# Patient Record
Sex: Male | Born: 1940
Health system: Southern US, Community
[De-identification: ages and names within clinical notes are randomized; demographics above are authoritative.]

## PROBLEM LIST (undated history)

## (undated) DIAGNOSIS — H18519 Endothelial corneal dystrophy, unspecified eye: Secondary | ICD-10-CM

## (undated) DIAGNOSIS — E785 Hyperlipidemia, unspecified: Secondary | ICD-10-CM

## (undated) DIAGNOSIS — H1851 Endothelial corneal dystrophy: Secondary | ICD-10-CM

## (undated) DIAGNOSIS — I10 Essential (primary) hypertension: Secondary | ICD-10-CM

## (undated) DIAGNOSIS — D649 Anemia, unspecified: Secondary | ICD-10-CM

## (undated) DIAGNOSIS — Z8546 Personal history of malignant neoplasm of prostate: Secondary | ICD-10-CM

## (undated) HISTORY — DX: Endothelial corneal dystrophy: H18.51

## (undated) HISTORY — DX: Hyperlipidemia, unspecified: E78.5

## (undated) HISTORY — DX: Anemia, unspecified: D64.9

## (undated) HISTORY — DX: Personal history of malignant neoplasm of prostate: Z85.46

## (undated) HISTORY — DX: Endothelial corneal dystrophy, unspecified eye: H18.519

## (undated) HISTORY — PX: EYE SURGERY: SHX253

## (undated) HISTORY — DX: Essential (primary) hypertension: I10

---

## 1996-05-06 HISTORY — PX: PROSTATE SURGERY: SHX751

## 2001-05-06 HISTORY — PX: OTHER SURGICAL HISTORY: SHX169

## 2003-04-21 ENCOUNTER — Encounter: Payer: Self-pay | Admitting: Internal Medicine

## 2003-05-30 ENCOUNTER — Encounter: Admission: RE | Admit: 2003-05-30 | Discharge: 2003-05-30 | Payer: Self-pay | Admitting: Internal Medicine

## 2003-07-28 ENCOUNTER — Encounter: Admission: RE | Admit: 2003-07-28 | Discharge: 2003-07-28 | Payer: Self-pay | Admitting: Internal Medicine

## 2004-06-11 ENCOUNTER — Ambulatory Visit: Payer: Self-pay | Admitting: Internal Medicine

## 2004-06-14 ENCOUNTER — Encounter: Admission: RE | Admit: 2004-06-14 | Discharge: 2004-06-14 | Payer: Self-pay | Admitting: Internal Medicine

## 2004-06-21 ENCOUNTER — Ambulatory Visit: Payer: Self-pay | Admitting: Gastroenterology

## 2004-07-03 ENCOUNTER — Ambulatory Visit: Payer: Self-pay | Admitting: Internal Medicine

## 2004-09-10 ENCOUNTER — Ambulatory Visit: Payer: Self-pay | Admitting: Internal Medicine

## 2004-09-24 ENCOUNTER — Ambulatory Visit: Payer: Self-pay | Admitting: Internal Medicine

## 2004-10-23 ENCOUNTER — Ambulatory Visit: Payer: Self-pay | Admitting: Internal Medicine

## 2005-03-04 ENCOUNTER — Ambulatory Visit: Payer: Self-pay | Admitting: Internal Medicine

## 2005-07-17 ENCOUNTER — Ambulatory Visit: Payer: Self-pay | Admitting: Family Medicine

## 2005-08-14 ENCOUNTER — Ambulatory Visit: Payer: Self-pay | Admitting: Internal Medicine

## 2005-08-23 ENCOUNTER — Ambulatory Visit: Payer: Self-pay | Admitting: Internal Medicine

## 2006-01-08 ENCOUNTER — Ambulatory Visit: Payer: Self-pay | Admitting: Internal Medicine

## 2006-03-19 ENCOUNTER — Ambulatory Visit: Payer: Self-pay | Admitting: Internal Medicine

## 2006-05-05 ENCOUNTER — Ambulatory Visit: Payer: Self-pay | Admitting: Family Medicine

## 2006-05-19 ENCOUNTER — Ambulatory Visit: Payer: Self-pay | Admitting: Internal Medicine

## 2006-05-21 ENCOUNTER — Encounter: Admission: RE | Admit: 2006-05-21 | Discharge: 2006-05-21 | Payer: Self-pay | Admitting: Internal Medicine

## 2006-06-12 ENCOUNTER — Ambulatory Visit: Payer: Self-pay | Admitting: Infectious Diseases

## 2006-06-24 DIAGNOSIS — I10 Essential (primary) hypertension: Secondary | ICD-10-CM | POA: Insufficient documentation

## 2006-06-24 DIAGNOSIS — Z8669 Personal history of other diseases of the nervous system and sense organs: Secondary | ICD-10-CM | POA: Insufficient documentation

## 2006-06-24 DIAGNOSIS — B957 Other staphylococcus as the cause of diseases classified elsewhere: Secondary | ICD-10-CM | POA: Insufficient documentation

## 2006-06-24 DIAGNOSIS — Z8614 Personal history of Methicillin resistant Staphylococcus aureus infection: Secondary | ICD-10-CM | POA: Insufficient documentation

## 2006-06-24 DIAGNOSIS — Z8546 Personal history of malignant neoplasm of prostate: Secondary | ICD-10-CM | POA: Insufficient documentation

## 2006-06-24 DIAGNOSIS — D179 Benign lipomatous neoplasm, unspecified: Secondary | ICD-10-CM | POA: Insufficient documentation

## 2006-06-24 DIAGNOSIS — E785 Hyperlipidemia, unspecified: Secondary | ICD-10-CM | POA: Insufficient documentation

## 2006-09-10 ENCOUNTER — Ambulatory Visit: Payer: Self-pay | Admitting: Infectious Diseases

## 2006-11-06 ENCOUNTER — Ambulatory Visit: Payer: Self-pay | Admitting: Internal Medicine

## 2006-12-29 ENCOUNTER — Telehealth (INDEPENDENT_AMBULATORY_CARE_PROVIDER_SITE_OTHER): Payer: Self-pay | Admitting: *Deleted

## 2006-12-30 ENCOUNTER — Ambulatory Visit: Payer: Self-pay | Admitting: Internal Medicine

## 2007-01-05 LAB — CONVERTED CEMR LAB
ALT: 21 units/L (ref 0–53)
AST: 21 units/L (ref 0–37)
Cholesterol: 132 mg/dL (ref 0–200)
HDL: 46.2 mg/dL (ref >39.0)
LDL Cholesterol: 76 mg/dL (ref 0–99)
Total CHOL/HDL Ratio: 2.9
Triglycerides: 48 mg/dL (ref 0–149)
VLDL: 10 mg/dL (ref 0–40)

## 2007-01-06 ENCOUNTER — Ambulatory Visit: Payer: Self-pay | Admitting: Internal Medicine

## 2007-01-06 ENCOUNTER — Encounter (INDEPENDENT_AMBULATORY_CARE_PROVIDER_SITE_OTHER): Payer: Self-pay | Admitting: *Deleted

## 2007-01-06 LAB — CONVERTED CEMR LAB
Cholesterol, target level: 200 mg/dL
HDL goal, serum: 40 mg/dL
LDL Goal: 130 mg/dL

## 2007-04-09 ENCOUNTER — Telehealth (INDEPENDENT_AMBULATORY_CARE_PROVIDER_SITE_OTHER): Payer: Self-pay | Admitting: *Deleted

## 2007-04-28 ENCOUNTER — Encounter (INDEPENDENT_AMBULATORY_CARE_PROVIDER_SITE_OTHER): Payer: Self-pay | Admitting: *Deleted

## 2007-05-07 HISTORY — PX: COLONOSCOPY: SHX174

## 2007-09-04 ENCOUNTER — Ambulatory Visit: Payer: Self-pay | Admitting: Gastroenterology

## 2007-09-18 ENCOUNTER — Ambulatory Visit: Payer: Self-pay | Admitting: Gastroenterology

## 2007-09-21 ENCOUNTER — Ambulatory Visit (HOSPITAL_COMMUNITY): Admission: RE | Admit: 2007-09-21 | Discharge: 2007-09-21 | Payer: Self-pay | Admitting: Gastroenterology

## 2007-09-23 ENCOUNTER — Ambulatory Visit (HOSPITAL_COMMUNITY): Admission: RE | Admit: 2007-09-23 | Discharge: 2007-09-23 | Payer: Self-pay | Admitting: Gastroenterology

## 2008-02-10 ENCOUNTER — Ambulatory Visit: Payer: Self-pay | Admitting: Internal Medicine

## 2008-03-21 ENCOUNTER — Telehealth (INDEPENDENT_AMBULATORY_CARE_PROVIDER_SITE_OTHER): Payer: Self-pay | Admitting: *Deleted

## 2008-03-25 ENCOUNTER — Ambulatory Visit: Payer: Self-pay | Admitting: Internal Medicine

## 2008-04-04 ENCOUNTER — Telehealth (INDEPENDENT_AMBULATORY_CARE_PROVIDER_SITE_OTHER): Payer: Self-pay | Admitting: *Deleted

## 2008-04-04 LAB — CONVERTED CEMR LAB
ALT: 24 units/L (ref 0–53)
AST: 29 units/L (ref 0–37)
Albumin: 3.5 g/dL (ref 3.5–5.2)
Alkaline Phosphatase: 42 units/L (ref 39–117)
BUN: 18 mg/dL (ref 6–23)
Basophils Absolute: 0 10*3/uL (ref 0.0–0.1)
Basophils Relative: 0.3 % (ref 0.0–3.0)
Bilirubin, Direct: 0.1 mg/dL (ref 0.0–0.3)
CO2: 27 meq/L (ref 19–32)
Calcium: 9 mg/dL (ref 8.4–10.5)
Chloride: 107 meq/L (ref 96–112)
Cholesterol: 142 mg/dL (ref 0–200)
Creatinine, Ser: 1.2 mg/dL (ref 0.4–1.5)
Eosinophils Absolute: 0.2 10*3/uL (ref 0.0–0.7)
Eosinophils Relative: 4.6 % (ref 0.0–5.0)
GFR calc Af Amer: 78 mL/min
GFR calc non Af Amer: 64 mL/min
Glucose, Bld: 85 mg/dL (ref 70–99)
HCT: 36.8 % — ABNORMAL LOW (ref 39.0–52.0)
HDL: 53.8 mg/dL (ref >39.0)
Hemoglobin: 12.4 g/dL — ABNORMAL LOW (ref 13.0–17.0)
LDL Cholesterol: 81 mg/dL (ref 0–99)
Lymphocytes Relative: 38.9 % (ref 12.0–46.0)
MCHC: 33.8 g/dL (ref 30.0–36.0)
MCV: 84.8 fL (ref 78.0–100.0)
Monocytes Absolute: 0.5 10*3/uL (ref 0.1–1.0)
Monocytes Relative: 10.4 % (ref 3.0–12.0)
Neutro Abs: 2 10*3/uL (ref 1.4–7.7)
Neutrophils Relative %: 45.8 % (ref 43.0–77.0)
PSA: 0.01 ng/mL — ABNORMAL LOW (ref 0.10–4.00)
Platelets: 187 10*3/uL (ref 150–400)
Potassium: 4 meq/L (ref 3.5–5.1)
RBC: 4.34 M/uL (ref 4.22–5.81)
RDW: 14.9 % — ABNORMAL HIGH (ref 11.5–14.6)
Sodium: 139 meq/L (ref 135–145)
Total Bilirubin: 1 mg/dL (ref 0.3–1.2)
Total CHOL/HDL Ratio: 2.6
Total Protein: 6.7 g/dL (ref 6.0–8.3)
Triglycerides: 36 mg/dL (ref 0–149)
VLDL: 7 mg/dL (ref 0–40)
WBC: 4.5 10*3/uL (ref 4.5–10.5)

## 2008-04-05 ENCOUNTER — Encounter (INDEPENDENT_AMBULATORY_CARE_PROVIDER_SITE_OTHER): Payer: Self-pay | Admitting: *Deleted

## 2008-04-05 ENCOUNTER — Ambulatory Visit: Payer: Self-pay | Admitting: Internal Medicine

## 2008-04-08 ENCOUNTER — Ambulatory Visit: Payer: Self-pay | Admitting: Internal Medicine

## 2008-04-08 DIAGNOSIS — D649 Anemia, unspecified: Secondary | ICD-10-CM | POA: Insufficient documentation

## 2008-04-13 ENCOUNTER — Encounter (INDEPENDENT_AMBULATORY_CARE_PROVIDER_SITE_OTHER): Payer: Self-pay | Admitting: *Deleted

## 2008-04-13 ENCOUNTER — Ambulatory Visit: Payer: Self-pay | Admitting: Internal Medicine

## 2008-04-13 LAB — CONVERTED CEMR LAB
OCCULT 1: NEGATIVE
OCCULT 2: NEGATIVE
OCCULT 3: NEGATIVE

## 2008-08-18 ENCOUNTER — Ambulatory Visit: Payer: Self-pay | Admitting: Internal Medicine

## 2008-08-28 LAB — CONVERTED CEMR LAB
Basophils Absolute: 0 10*3/uL (ref 0.0–0.1)
Basophils Relative: 0.2 % (ref 0.0–3.0)
Cholesterol: 183 mg/dL (ref 0–200)
Eosinophils Absolute: 0.2 10*3/uL (ref 0.0–0.7)
Eosinophils Relative: 4.8 % (ref 0.0–5.0)
HCT: 39.2 % (ref 39.0–52.0)
HDL: 49.6 mg/dL (ref >39.00)
Hemoglobin: 13 g/dL (ref 13.0–17.0)
LDL Cholesterol: 125 mg/dL — ABNORMAL HIGH (ref 0–99)
Lymphocytes Relative: 37.2 % (ref 12.0–46.0)
Lymphs Abs: 1.7 10*3/uL (ref 0.7–4.0)
MCHC: 33.1 g/dL (ref 30.0–36.0)
MCV: 85 fL (ref 78.0–100.0)
Monocytes Absolute: 0.5 10*3/uL (ref 0.1–1.0)
Monocytes Relative: 9.7 % (ref 3.0–12.0)
Neutro Abs: 2.3 10*3/uL (ref 1.4–7.7)
Neutrophils Relative %: 48.1 % (ref 43.0–77.0)
Platelets: 211 10*3/uL (ref 150.0–400.0)
RBC: 4.61 M/uL (ref 4.22–5.81)
RDW: 16.7 % — ABNORMAL HIGH (ref 11.5–14.6)
Total CHOL/HDL Ratio: 4
Triglycerides: 42 mg/dL (ref 0.0–149.0)
VLDL: 8.4 mg/dL (ref 0.0–40.0)
WBC: 4.7 10*3/uL (ref 4.5–10.5)

## 2008-08-29 ENCOUNTER — Encounter (INDEPENDENT_AMBULATORY_CARE_PROVIDER_SITE_OTHER): Payer: Self-pay | Admitting: *Deleted

## 2008-09-02 ENCOUNTER — Ambulatory Visit: Payer: Self-pay | Admitting: Internal Medicine

## 2008-09-02 LAB — CONVERTED CEMR LAB
Cholesterol, target level: 200 mg/dL
HDL goal, serum: 40 mg/dL
LDL Goal: 160 mg/dL

## 2008-10-10 ENCOUNTER — Ambulatory Visit: Payer: Self-pay | Admitting: Infectious Diseases

## 2008-10-10 ENCOUNTER — Encounter (INDEPENDENT_AMBULATORY_CARE_PROVIDER_SITE_OTHER): Payer: Self-pay | Admitting: Family Medicine

## 2009-02-07 ENCOUNTER — Ambulatory Visit: Payer: Self-pay | Admitting: Internal Medicine

## 2009-06-14 ENCOUNTER — Telehealth (INDEPENDENT_AMBULATORY_CARE_PROVIDER_SITE_OTHER): Payer: Self-pay | Admitting: *Deleted

## 2009-08-16 IMAGING — CR DG ABDOMEN 1V
1 series · 1 of 1 positions shown · non-contrast
Comparison: CT dated 06/14/2004.

CLINICAL DATA: Incomplete screening colonoscopy.  Previous
hyperplastic colon polyps.

ABDOMEN - 1 VIEW

[view not recorded]
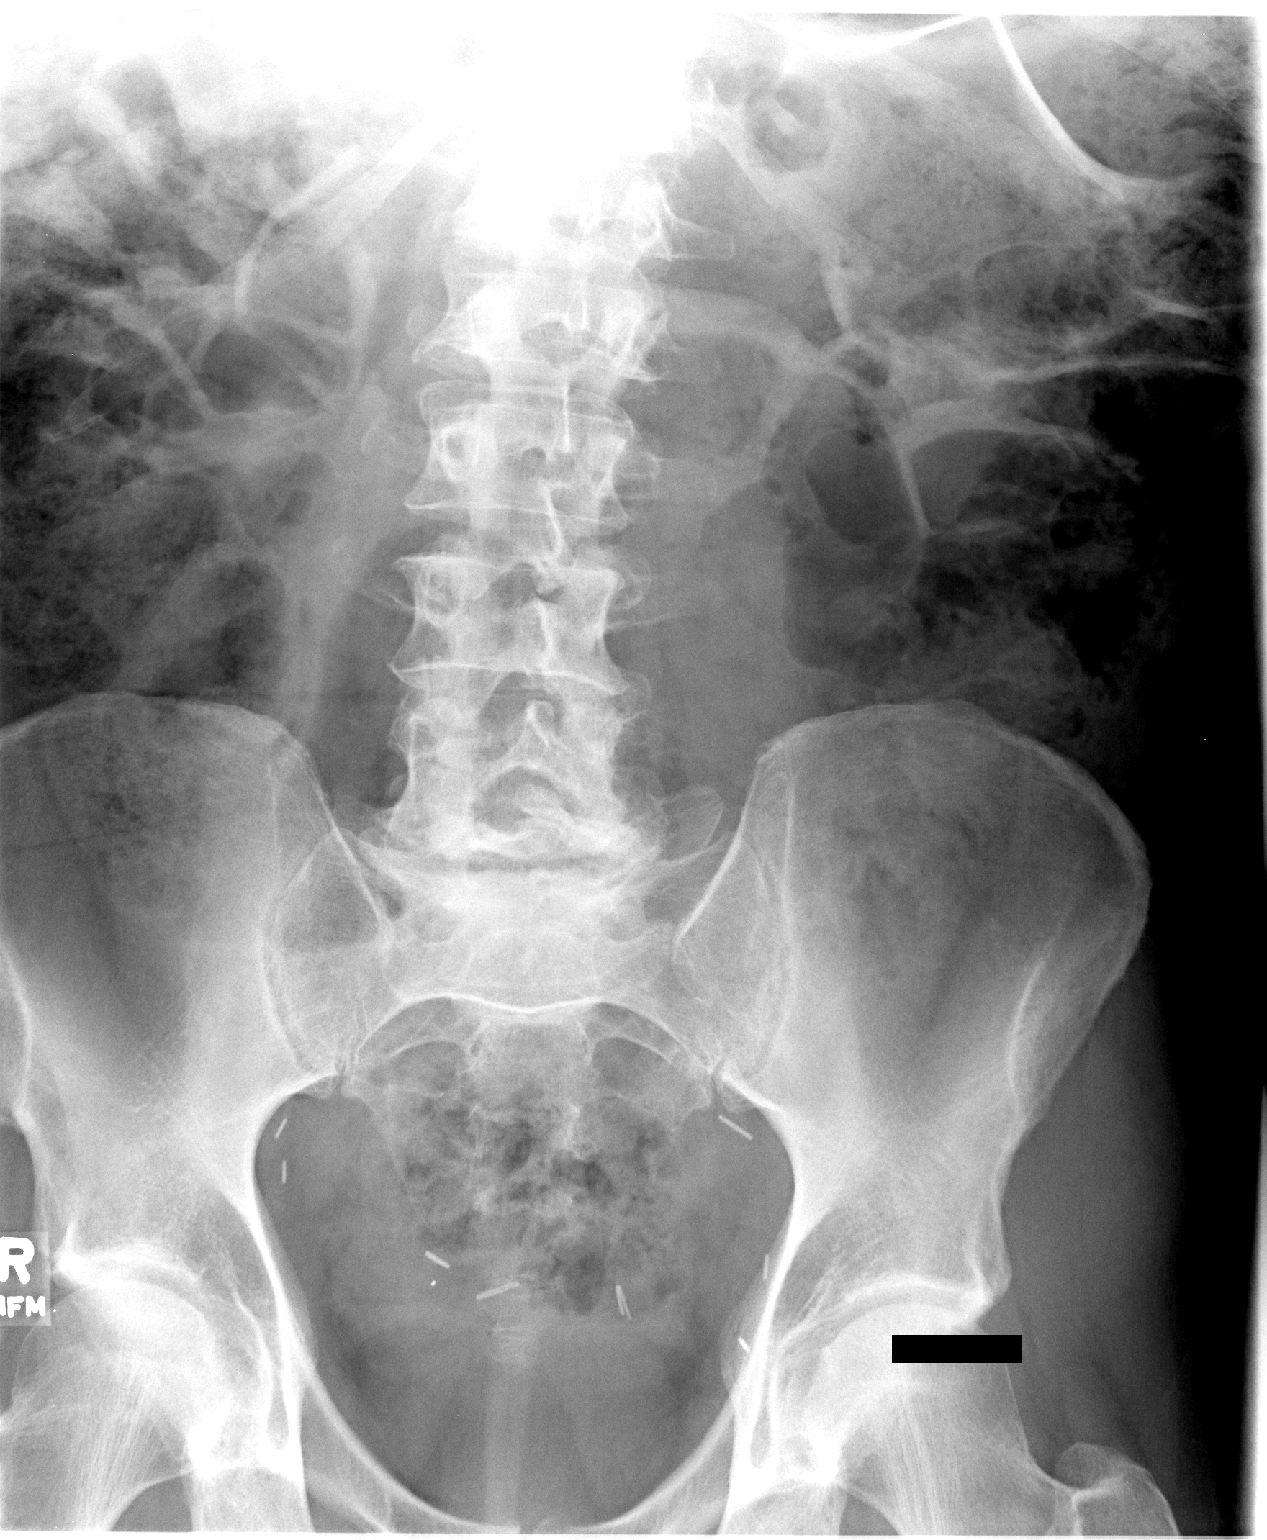

[1 of 1 positions shown; findings below may reference images not displayed]

FINDINGS: A supine radiograph of the abdomen was obtained as a
scout image for a scheduled barium enema.  This demonstrates a
normal bowel gas pattern with a significant amount of stool in the
cecum and right colon as well as in the distal transverse colon and
splenic flexure.  Bilateral pelvic surgical clips are noted.  Also
demonstrated are mild lumbar spine degenerative changes and
scoliosis.
IMPRESSION: Significant amount of retained stool in the colon.  This precludes
a detailed barium enema at this time.  This is being rescheduled
following colon cleansing.

## 2009-08-18 IMAGING — RF DG BE W/ AIR HIGH DENSITY
13 of 24 series · 13 of 24 positions shown · IV contrast (agent unspecified)
Comparison: CT abdomen pelvis 06/14/2004

CLINICAL DATA: Patient with history of prior colonic polyps
removed.  The patient had recent colonoscopy.  Due to the elongate
tortuous colon, the cecum through the hepatic flexure were not well
visualized.

AIR-CONTRAST BARIUM ENEMA
TECHNIQUE: After obtaining a scout radiograph air-contrast barium
enema was performed under fluoroscopy using high-density barium.
Contrast: :Polibar contrast

[Series 1: run · 1 of 1 slices shown (1 of 10)]
[im 1/1]
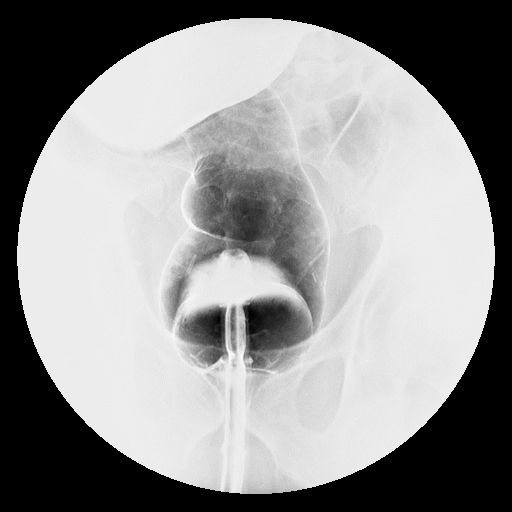

[Series 3: run · 1 of 2 slices shown (2 of 10)]
[im 1/2]
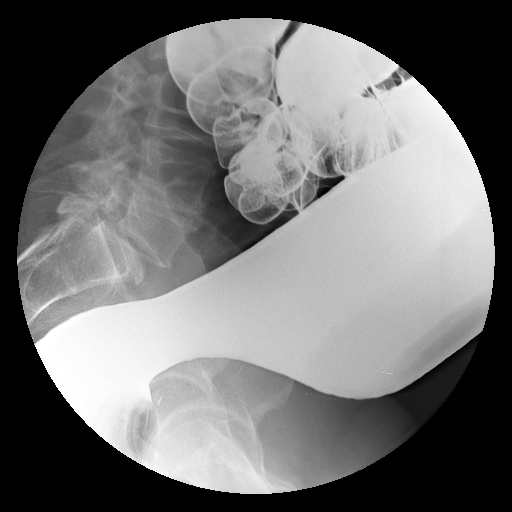

[Series 5: run · 1 of 1 slices shown (3 of 10)]
[im 1/1]
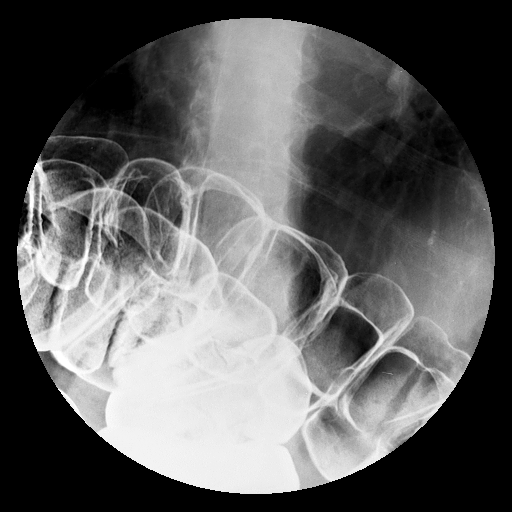

[Series 7: run · 1 of 1 slices shown (4 of 10)]
[im 1/1]
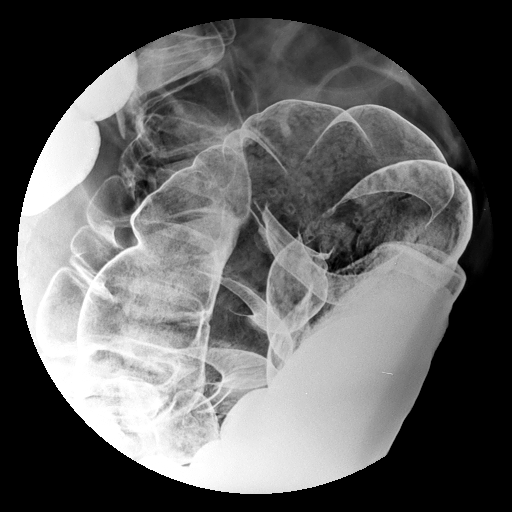

[Series 9: run · 1 of 1 slices shown (5 of 10)]
[im 1/1]
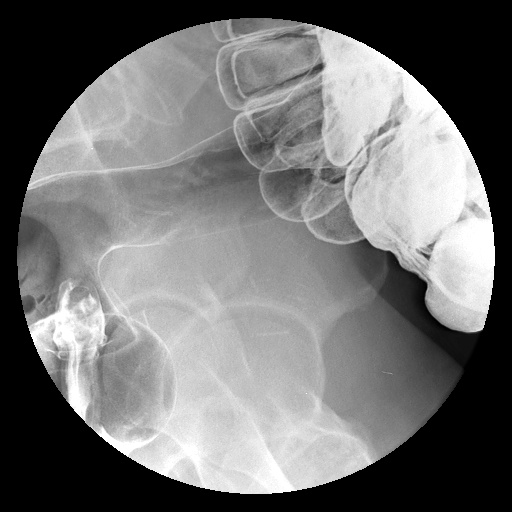

[Series 11: run · 1 of 1 slices shown (6 of 10)]
[im 1/1]
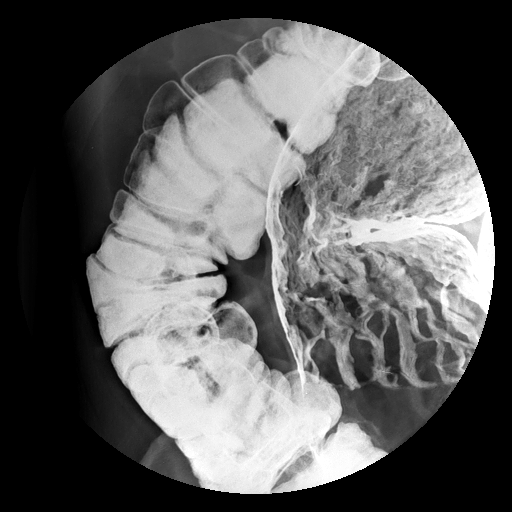

[Series 13: run · 1 of 1 slices shown (7 of 10)]
[im 1/1]
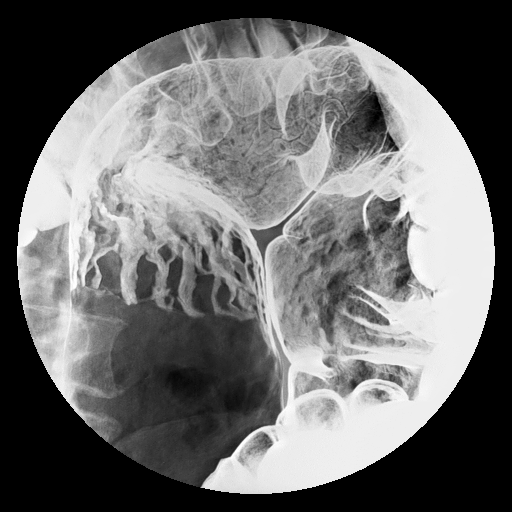

[Series 14: run · 1 of 2 slices shown (8 of 10)]
[im 1/2]
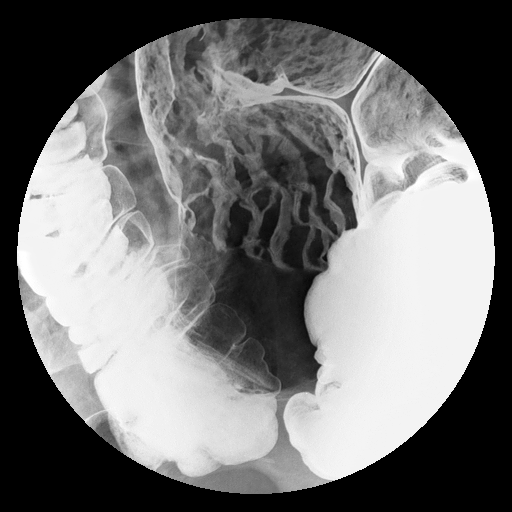

[Series 16: run · 1 of 2 slices shown (9 of 10)]
[im 1/2]
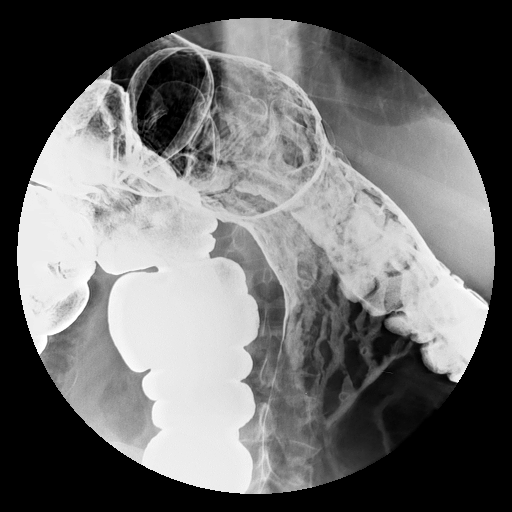

[Series 18: run · 1 of 1 slices shown (10 of 10)]
[im 1/1]
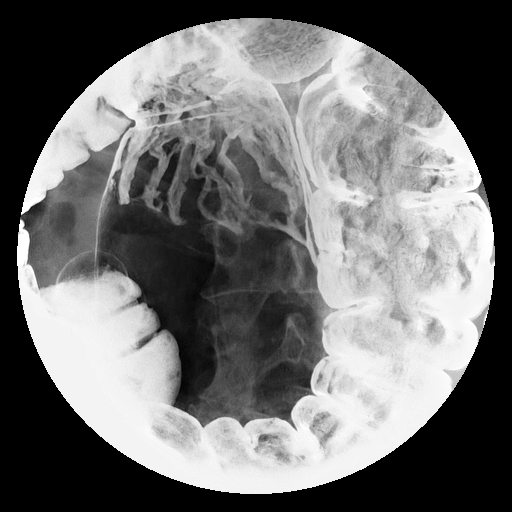

[Series 1002: view not recorded · 0.20mm/px · 1 of 1 slices shown (1 of 3)]
[im 1/1]
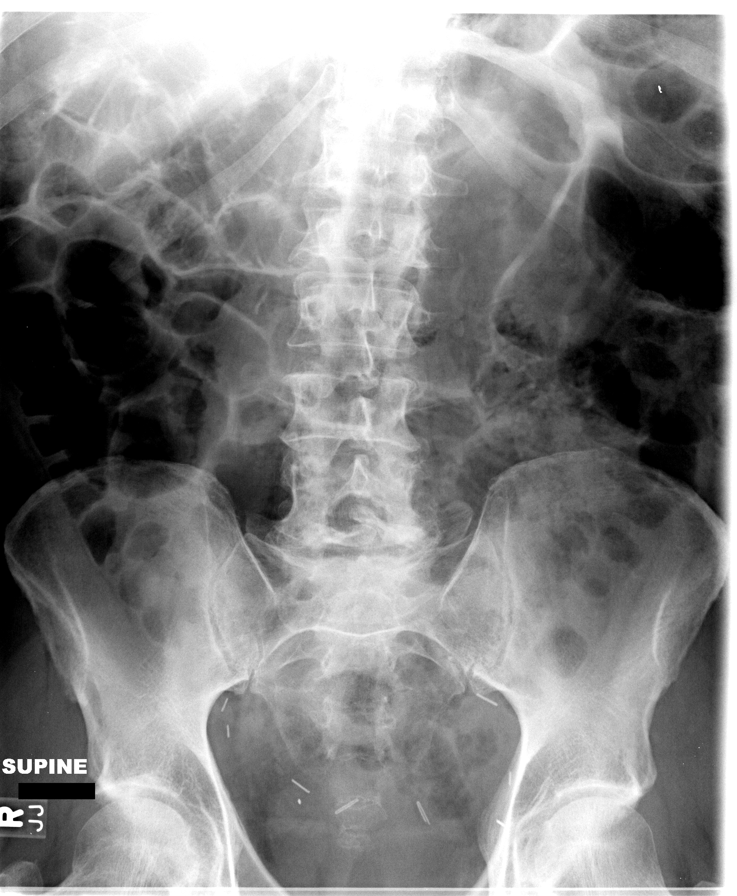

[Series 1004: view not recorded · 0.20mm/px · 1 of 1 slices shown (2 of 3)]
[im 1/1]
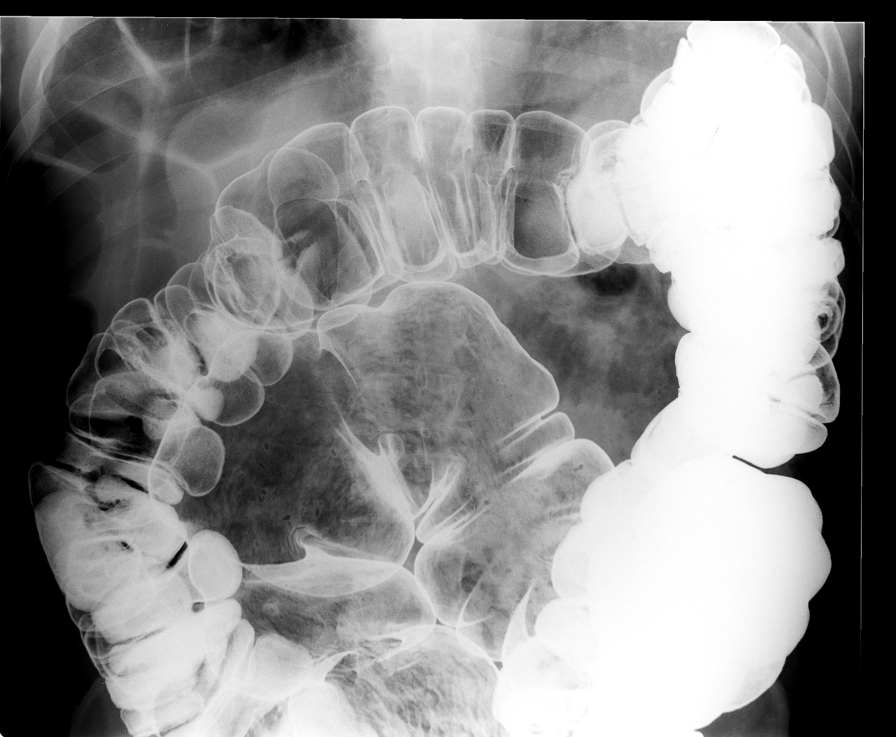

[Series 1006: view not recorded · 0.20mm/px · 1 of 1 slices shown (3 of 3)]
[im 1/1]
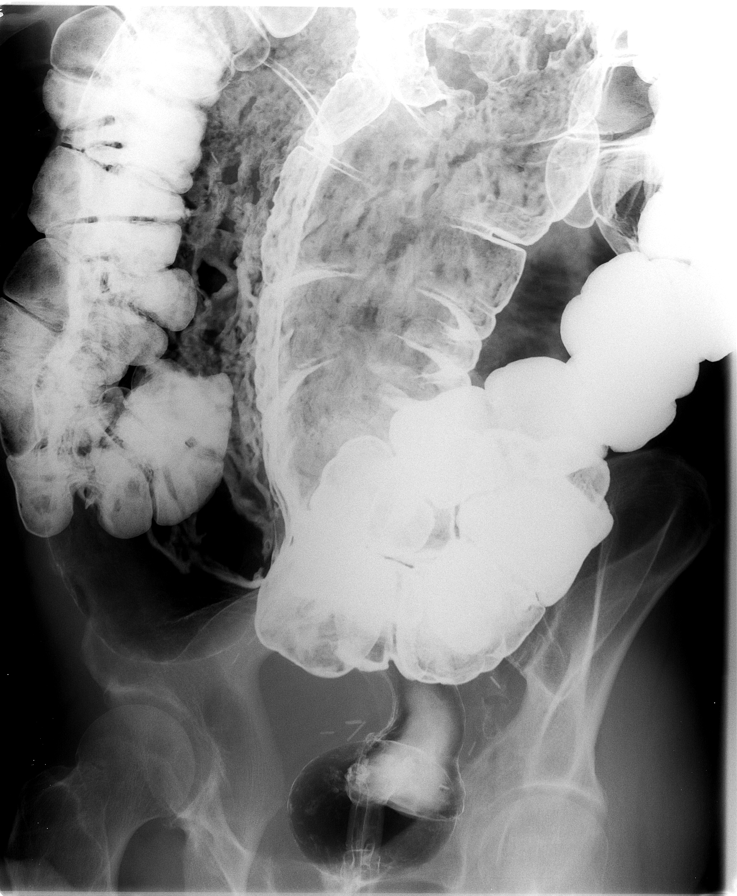

[13 of 24 positions shown; findings below may reference images not displayed]

FINDINGS: A scout view of the abdomen and pelvis shows no evidence
of retained stool.  Gas is seen throughout the entire colon and
rectum, without evidence of obstruction.  Surgical clips are
identified in the pelvis.

The sigmoid colon is redundant and very capacious.  The sigmoid
colon collects contrast, requiring  many maneuvers of the patient
to have contrast egress from the sigmoid colon.  Is also noted that
the distal sigmoid is very smooth, with a loss of haustral markings
in its inferior aspect. After filling of the colon with contrast
and air, multiple attempts were made to drain the barium for an
adequate double contrast study.  However due to the the patient's
capacious sigmoid colon it was difficult to well to completely
drain the barium.  No obstructing mass or stricture is seen in the
cecum or ascending colon.  Small polyps cannot be excluded from the
right side of the colon.  The flexures, transverse colon,
descending colon are visualized with better air contrast technique
and show no evidence of polyp, mass, or stricture.
IMPRESSION: 1.  Limited double contrast enema due to a redundant colon, and a
large, capacious and atonic sigmoid colon.  The sigmoid colon has a
a very smooth segment with loss of haustral markings. Question if
there is a history of chronic constipation.
2.  It is difficult to exclude polyps in the region of the cecum or
ascending colon.
3.  No large mass, stricture or obstruction is identified

## 2009-10-19 ENCOUNTER — Ambulatory Visit: Payer: Self-pay | Admitting: Internal Medicine

## 2009-10-20 ENCOUNTER — Ambulatory Visit: Payer: Self-pay | Admitting: Internal Medicine

## 2009-10-26 ENCOUNTER — Ambulatory Visit: Payer: Self-pay | Admitting: Internal Medicine

## 2009-10-27 LAB — CONVERTED CEMR LAB
ALT: 23 units/L (ref 0–53)
AST: 22 units/L (ref 0–37)
Albumin: 3.8 g/dL (ref 3.5–5.2)
Alkaline Phosphatase: 51 units/L (ref 39–117)
BUN: 18 mg/dL (ref 6–23)
Basophils Absolute: 0 10*3/uL (ref 0.0–0.1)
Basophils Relative: 0.6 % (ref 0.0–3.0)
Bilirubin, Direct: 0.1 mg/dL (ref 0.0–0.3)
CO2: 29 meq/L (ref 19–32)
Calcium: 9.3 mg/dL (ref 8.4–10.5)
Chloride: 107 meq/L (ref 96–112)
Cholesterol: 171 mg/dL (ref 0–200)
Creatinine, Ser: 1.1 mg/dL (ref 0.4–1.5)
Eosinophils Absolute: 0.2 10*3/uL (ref 0.0–0.7)
Eosinophils Relative: 4 % (ref 0.0–5.0)
GFR calc non Af Amer: 67.66 mL/min (ref >60)
Glucose, Bld: 94 mg/dL (ref 70–99)
HCT: 45 % (ref 39.0–52.0)
HDL: 41.9 mg/dL (ref >39.00)
Hemoglobin: 15.4 g/dL (ref 13.0–17.0)
LDL Cholesterol: 113 mg/dL — ABNORMAL HIGH (ref 0–99)
Lymphocytes Relative: 33.2 % (ref 12.0–46.0)
Lymphs Abs: 1.8 10*3/uL (ref 0.7–4.0)
MCHC: 34.2 g/dL (ref 30.0–36.0)
MCV: 93.5 fL (ref 78.0–100.0)
Monocytes Absolute: 0.4 10*3/uL (ref 0.1–1.0)
Monocytes Relative: 6.7 % (ref 3.0–12.0)
Neutro Abs: 3 10*3/uL (ref 1.4–7.7)
Neutrophils Relative %: 55.5 % (ref 43.0–77.0)
PSA: 0 ng/mL — ABNORMAL LOW (ref 0.10–4.00)
Platelets: 218 10*3/uL (ref 150.0–400.0)
Potassium: 5.3 meq/L — ABNORMAL HIGH (ref 3.5–5.1)
RBC: 4.81 M/uL (ref 4.22–5.81)
RDW: 13.1 % (ref 11.5–14.6)
Sodium: 141 meq/L (ref 135–145)
TSH: 1.95 microintl units/mL (ref 0.35–5.50)
Total Bilirubin: 0.8 mg/dL (ref 0.3–1.2)
Total CHOL/HDL Ratio: 4
Total Protein: 6.2 g/dL (ref 6.0–8.3)
Triglycerides: 79 mg/dL (ref 0.0–149.0)
VLDL: 15.8 mg/dL (ref 0.0–40.0)
WBC: 5.4 10*3/uL (ref 4.5–10.5)

## 2010-02-22 ENCOUNTER — Ambulatory Visit: Payer: Self-pay | Admitting: Internal Medicine

## 2010-06-03 LAB — CONVERTED CEMR LAB
Cholesterol, target level: 200 mg/dL
HDL goal, serum: 40 mg/dL
LDL Goal: 160 mg/dL

## 2010-06-05 NOTE — Assessment & Plan Note (Signed)
Summary: med refills//lch   Vital Signs:  Patient profile:   70 year old male Height:      71.5 inches Weight:      178.8 pounds BMI:     24.68 Temp:     99.1 degrees F oral Pulse rate:   72 / minute Resp:     14 per minute BP sitting:   128 / 80  (left arm) Cuff size:   large  Vitals Entered By: Shonna Chock (October 19, 2009 3:35 PM) CC: CPX and refill Diovan & Crestor Comments REVIEWED MED LIST, PATIENT AGREED DOSE AND INSTRUCTION CORRECT    CC:  CPX and refill Diovan & Crestor.  History of Present Illness: Here for Medicare AWV:  1.   Risk factors based on Past M, S, F history:PMH of MRSA, dyslipidemia,HTN 2.   Physical Activitieswalking once daily < 30 min 3.   Depression/mood: denied 4.   Hearing: whisper heard @ 6 ft 5.   ADL's: no limitations 6.   Fall Risk: minimal balance issues ("aging"), no falls to date(see HTN review below). Gait normal  to observation 7.   Home Safety: no risks identified 8.   Height, weight, &visual acuity:see VS . Temp minimally elevated w/o symptoms/signs of infection.Followed @ least annually by Dr Noel Gerold for Fuchs Dystrophy. Vision grossly normal with lenses  reading wall charts  9.   Counseling: no need identified 10.   Labs ordered based on risk factors: see Orders 11.           Referral Coordination: none  12.           Care Plan: See Instructions; Immunizations updated 13.            Cognitive Assessment:OX3. He read & wrote answers to these queries. Memory & recall normal.    Hyperlipidemia Follow-Up      This is a 70 year old man who presents for Hyperlipidemia follow-up.  The patient denies muscle aches, GI upset, flushing, itching, constipation, diarrhea, and fatigue.  The patient denies the following symptoms: chest pain/pressure, exercise intolerance, dypsnea, and palpitations.  Compliance with medications (by patient report) has been near 100%.  Dietary compliance has been good.  The patient reports exercising daily.  Adjunctive  measures currently used by the patient include ASA, folic acid, Niacin  and fish oil supplements.  On Crestor 20 mg 1/2 pill weekly  He is due for Urologic F/U with Dr Aldean Ast.Last appt was ? 2009  Hypertension Follow-Up      The patient also presents for Hypertension follow-up.  The patient denies lightheadedness, urinary frequency, headaches, rash, and fatigue.  Compliance with medications (by patient report) has been near 100%.  Adjunctive measures currently used by the patient include salt restriction.    Preventive Screening-Counseling & Management  Alcohol-Tobacco     Alcohol drinks/day: <1     Smoking Status: never  Caffeine-Diet-Exercise     Caffeine use/day: 2 cups / day     Diet Comments: none     Does Patient Exercise: yes     Type of exercise: walking     Exercise (avg: min/session): <30     Times/week: 7  Hep-HIV-STD-Contraception     Dental Visit-last 6 months yes  Safety-Violence-Falls     Seat Belt Use: yes     Smoke Detectors: yes     Violence in the Home: no risk noted     Fall Risk: none  Allergies: 1)  ! Accupril  Past History:  Past Medical History: MRSA skin infections Hyperlipidemia Hypertension Prostate cancer, hx of, Dr Leonarda Salon Dystrophy, Dr Noel Gerold Lipomas, hx of Microscopic hematuria, Dr Aldean Ast  Past Surgical History: Prostatectomy, 1998, Dr Aldean Ast Cataract implant OD Flex sig. negative . (2000) Colonscopy - small polyps (2004 & 2009)  Family History: Family History of CAD Male 1st degree relative 68. Father: Lung cancer, smoker Mother: HTN, M in 78s, GI blood. loss Siblings: sister- Breast cancer with mets to liver  Social History: Never Smoked Alcohol use-yes, occasionally Retired Married Regular exercise-yes Caffeine use/day:  2 cups / day Dental Care w/in 6 mos.:  yes Fall Risk:  none  Review of Systems       The patient complains of weight gain.  The patient denies anorexia, fever, decreased hearing,  hoarseness, chest pain, syncope, dyspnea on exertion, peripheral edema, prolonged cough, headaches, hemoptysis, abdominal pain, melena, hematochezia, severe indigestion/heartburn, suspicious skin lesions, depression, unusual weight change, abnormal bleeding, enlarged lymph nodes, and angioedema.         10-12# weight gain over Winter GU:  Denies discharge, dysuria, and hematuria; Minor incontinence.  Physical Exam  General:  Appears younger than age,well-nourished; alert,appropriate and cooperative throughout examination Head:  Normocephalic and atraumatic without obvious abnormalities. No apparent alopecia; moustache Eyes:  No corneal or conjunctival inflammation noted.Perrla. Funduscopic exam benign, without hemorrhages, exudates or papilledema. Vision grossly normal. Ears:  External ear exam shows no significant lesions or deformities.  Otoscopic examination reveals clear canals, tympanic membranes are intact bilaterally without bulging, retraction, inflammation or discharge. Hearing is grossly normal bilaterally to whisper @ 6 ft. Nose:  External nasal examination shows no deformity or inflammation. Nasal mucosa are pink and moist without lesions or exudates. Mouth:  Oral mucosa and oropharynx without lesions or exudates.  Teeth in good repair. Neck:  No deformities, masses, or tenderness noted. Lungs:  Normal respiratory effort, chest expands symmetrically. Lungs are clear to auscultation, no crackles or wheezes. Heart:  Normal rate and regular rhythm. S1 and S2 normal without gallop, murmur, click, rub or other extra sounds. Abdomen:  Bowel sounds positive,abdomen soft and non-tender without masses, organomegaly or hernias noted. Genitalia:  Dr Aldean Ast Msk:  No deformity or scoliosis noted of thoracic or lumbar spine.   Pulses:  R and L carotid,radial,dorsalis pedis and posterior tibial pulses are full and equal bilaterally Extremities:  No clubbing, cyanosis, edema, or deformity noted  with normal full range of motion of all joints.   Neurologic:  alert & oriented X3, strength normal in all extremities, gait normal, DTRs symmetrical and 1/2+ @ knees, neg Rhomberg Skin:  Lipoma RUE; boss L forehead Cervical Nodes:  No lymphadenopathy noted Axillary Nodes:  No palpable lymphadenopathy Psych:  memory intact for recent and remote, normally interactive, good eye contact, not anxious appearing, and not depressed appearing.     Impression & Recommendations:  Problem # 1:  PREVENTIVE HEALTH CARE (ICD-V70.0)  Orders: Subsequent annual wellness visit with prevention plan (Z6109)  Problem # 2:  HYPERTENSION (ICD-401.9)  controlled The following medications were removed from the medication list:    Diovan 80 Mg Tabs (Valsartan) .Marland Kitchen... Take 1 tablet by mouth once a day **appointment due** His updated medication list for this problem includes:    Losartan Potassium 50 Mg Tabs (Losartan potassium) .Marland Kitchen... 1 once daily  Orders: EKG w/ Interpretation (93000)  Problem # 3:  HYPERLIPIDEMIA (ICD-272.4)  His updated medication list for this problem includes:    Niacin Cr 500 Mg Cr-tabs (Niacin) .Marland KitchenMarland KitchenMarland KitchenMarland Kitchen  1 by mouth once daily  Problem # 4:  PROSTATE CANCER, HX OF (ICD-V10.46) as per Dr Aldean Ast  Complete Medication List: 1)  Garlic 1500 1500 Mg Caps (Garlic) 2)  Fish Oil Caps (Omega-3 fatty acids caps) .... 2 tabs daily 3)  Aspirin 81 Mg Tabs (Aspirin) .... Take 1 tablet by mouth once a day 4)  Crestor 20 Mg Tabs (rosuvastatin Calcium)  .... Take as directed 5)  Niacin Cr 500 Mg Cr-tabs (Niacin) .Marland Kitchen.. 1 by mouth once daily 6)  Loratadine 10mg  Qd  7)  Multivitamin  8)  Folic Acid 1mg   9)  Losartan Potassium 50 Mg Tabs (Losartan potassium) .Marland Kitchen.. 1 once daily  Other Orders: Tdap => 19yrs IM (16109) Admin 1st Vaccine (60454)  Patient Instructions: 1)  Schedule fasting labs: 2)  BMP ; 3)  Hepatic Panel ; 4)  Lipid Panel ; 5)  TSH ; 6)  CBC w/ Diff; 7)  PSA . 8)  It is  important that you exercise regularly at least 20 minutes 5 times a week. If you develop chest pain, have severe difficulty breathing, or feel very tired , stop exercising immediately and seek medical attention. 9)  Take an Aspirin every day. 10)  Check your Blood Pressure regularly. If it is above: 140/90 ON AVERAGE you should make an appointment. Prescriptions: CRESTOR 20 MG TABS (ROSUVASTATIN CALCIUM) Take as directed  #30 x 1   Entered and Authorized by:   Marga Melnick MD   Signed by:   Marga Melnick MD on 10/19/2009   Method used:   Print then Give to Patient   RxID:   [id number] LOSARTAN POTASSIUM 50 MG TABS (LOSARTAN POTASSIUM) 1 once daily  #90 x 3   Entered and Authorized by:   Marga Melnick MD   Signed by:   Marga Melnick MD on 10/19/2009   Method used:   Print then Give to Patient   RxID:   [id number]    Immunizations Administered:  Tetanus Vaccine:    Vaccine Type: Tdap    Site: right deltoid    Mfr: GlaxoSmithKline    Dose: 0.5 ml    Route: IM    Given by: Chrae Malloy    Exp. Date: 07/29/2011    Lot #: [id number]    VIS given: 03/24/07 version given October 19, 2009.

## 2010-06-05 NOTE — Assessment & Plan Note (Signed)
Summary: pneumonia inj/cbs   Nurse Visit   Allergies: 1)  ! Accupril  Immunizations Administered:  Pneumonia Vaccine:    Vaccine Type: Pneumovax (Medicare)    Site: right deltoid    Mfr: Merck    Dose: 0.5 ml    Route: IM    Given by: Shonna Chock    Exp. Date: 02/28/2011    Lot #: [id number]  Orders Added: 1)  Pneumococcal Vaccine [90732] 2)  Admin 1st Vaccine [78469]

## 2010-06-05 NOTE — Progress Notes (Signed)
Summary: Refill request  Phone Note Refill Request Message from:  Patient  Refills Requested: Medication #1:  DIOVAN 80 MG TABS Take 1 tablet by mouth once a day [place]   Method Requested: Fax to Local Pharmacy Initial call taken by: Shonna Chock,  June 14, 2009 4:32 PM    Prescriptions: DIOVAN 80 MG TABS (VALSARTAN) Take 1 tablet by mouth once a day  #90 x 0   Entered by:   Shonna Chock   Authorized by:   Marga Melnick MD   Signed by:   Shonna Chock on 06/14/2009   Method used:   Electronically to        [facility] (retail)       [address]       [place], [place]  [id number]       Ph: [phone]       Fax: [phone]   RxID:   [id number]

## 2010-06-05 NOTE — Assessment & Plan Note (Signed)
Summary: flu shot///sph   Nurse Visit  CC: Flu shot./kb   Allergies: 1)  ! Accupril  Orders Added: 1)  Flu Vaccine 47yrs + MEDICARE PATIENTS [Q2039] 2)  Administration Flu vaccine - MCR [G0008]          Flu Vaccine Consent Questions     Do you have a history of severe allergic reactions to this vaccine? no    Any prior history of allergic reactions to egg and/or gelatin? no    Do you have a sensitivity to the preservative Thimersol? no    Do you have a past history of Guillan-Barre Syndrome? no    Do you currently have an acute febrile illness? no    Have you ever had a severe reaction to latex? no    Vaccine information given and explained to patient? yes    Are you currently pregnant? no    Lot Number:[id number]   Exp Date:11/03/2010   Site Given  Left Deltoid IMu

## 2010-09-21 NOTE — Assessment & Plan Note (Signed)
[facility]                        [place] OFFICE NOTE   NAME:Ronald Ross, Ronald Ross                     MRN:          [id number]  DATE:05/19/2006                            DOB:          1940-10-30    [name] was seen May 19, 2006 for an erythematous lesion over the right  wrist.   He was seen May 05, 2006 by Dr. Loreen Freud for a lesion over the  left thigh.  A culture revealed methicillin-resistant staphylococcus  aureus and Septra DS was substituted for erythromycin which was  initially prescribed.   He also had been found to have an abscess at the right jaw on July 22, 2005, for which Augmentin was prescribed.  Culture also revealed MSRA.  At that time, it was sensitive to the erythromycin prescribed for 10  days.   The wrist lesion developed while he was on the Septra DS.  There was  nearly a 3.5 x 3 cm of erythema and subcutaneous edema over the dorsum  of the right wrist.  He had no lymphadenopathy.   Doxycycline 100 mg twice a day was prescribed for 15 days.  Bactroban  was to be used under his nails and in the naris for 2 weeks.   I am concerned because of recurrent lesions, particularly in the context  of his prior prostate cancer, and the fact that his wife has had breast  cancer on 2 different occasions 12 years apart.   I have discussed with him, and he agrees to Infectious Disease  consultation through [facility].   If the wrist lesion fails to respond to the doxycycline, he will have  films made of the wrist to rule out any concomitant osteomyelitis.     [name]. Alwyn Ren, MD,FACP,FCCP  Electronically Signed    [facility]  DD: 05/20/2006  DT: 05/20/2006  Job #: [id number]

## 2010-11-09 ENCOUNTER — Other Ambulatory Visit: Payer: Self-pay | Admitting: Internal Medicine

## 2010-11-09 MED ORDER — LOSARTAN POTASSIUM 50 MG PO TABS: 50.0000 mg | ORAL_TABLET | Freq: Every day | ORAL | Status: DC

## 2010-11-09 NOTE — Telephone Encounter (Signed)
Called [facility] in [place] since this was requesting pharmacy at [phone]

## 2010-12-28 ENCOUNTER — Ambulatory Visit (INDEPENDENT_AMBULATORY_CARE_PROVIDER_SITE_OTHER): Payer: PRIVATE HEALTH INSURANCE | Admitting: Internal Medicine

## 2010-12-28 ENCOUNTER — Encounter: Payer: Self-pay | Admitting: Internal Medicine

## 2010-12-28 DIAGNOSIS — I10 Essential (primary) hypertension: Secondary | ICD-10-CM

## 2010-12-28 DIAGNOSIS — E785 Hyperlipidemia, unspecified: Secondary | ICD-10-CM

## 2010-12-28 DIAGNOSIS — Z8546 Personal history of malignant neoplasm of prostate: Secondary | ICD-10-CM

## 2010-12-28 DIAGNOSIS — D649 Anemia, unspecified: Secondary | ICD-10-CM

## 2010-12-28 DIAGNOSIS — Z0000 Encounter for general adult medical examination without abnormal findings: Secondary | ICD-10-CM

## 2010-12-28 LAB — CBC WITH DIFFERENTIAL/PLATELET
Basophils Absolute: 0 10*3/uL (ref 0.0–0.1)
Eosinophils Absolute: 0.1 10*3/uL (ref 0.0–0.7)
Eosinophils Relative: 1.7 % (ref 0.0–5.0)
HCT: 35 % — ABNORMAL LOW (ref 39.0–52.0)
Hemoglobin: 11.4 g/dL — ABNORMAL LOW (ref 13.0–17.0)
Lymphocytes Relative: 35.8 % (ref 12.0–46.0)
Lymphs Abs: 1.8 10*3/uL (ref 0.7–4.0)
MCHC: 32.7 g/dL (ref 30.0–36.0)
MCV: 85.9 fl (ref 78.0–100.0)
Monocytes Absolute: 0.4 10*3/uL (ref 0.1–1.0)
Monocytes Relative: 7.3 % (ref 3.0–12.0)
Neutrophils Relative %: 54.5 % (ref 43.0–77.0)
Platelets: 204 10*3/uL (ref 150.0–400.0)
RBC: 4.08 Mil/uL — ABNORMAL LOW (ref 4.22–5.81)
RDW: 14 % (ref 11.5–14.6)
WBC: 5.1 10*3/uL (ref 4.5–10.5)

## 2010-12-28 LAB — HEPATIC FUNCTION PANEL
ALT: 21 U/L (ref 0–53)
Alkaline Phosphatase: 47 U/L (ref 39–117)
Bilirubin, Direct: 0.1 mg/dL (ref 0.0–0.3)
Total Bilirubin: 0.6 mg/dL (ref 0.3–1.2)

## 2010-12-28 LAB — TSH: TSH: 1.59 u[IU]/mL (ref 0.35–5.50)

## 2010-12-28 LAB — BASIC METABOLIC PANEL
BUN: 20 mg/dL (ref 6–23)
CO2: 28 mEq/L (ref 19–32)
Chloride: 108 mEq/L (ref 96–112)
Creatinine, Ser: 1.1 mg/dL (ref 0.4–1.5)
GFR: 72.55 mL/min (ref >60.00)
Glucose, Bld: 84 mg/dL (ref 70–99)
Potassium: 3.8 mEq/L (ref 3.5–5.1)
Sodium: 143 mEq/L (ref 135–145)

## 2010-12-28 LAB — LIPID PANEL
Cholesterol: 135 mg/dL (ref 0–200)
LDL Cholesterol: 84 mg/dL (ref 0–99)
Total CHOL/HDL Ratio: 3
Triglycerides: 24 mg/dL (ref 0.0–149.0)

## 2010-12-28 MED ORDER — LOSARTAN POTASSIUM 50 MG PO TABS: 50.0000 mg | ORAL_TABLET | Freq: Every day | ORAL | Status: DC

## 2010-12-28 NOTE — Patient Instructions (Addendum)
Preventive Health Care: Exercise at least 30-45 minutes a day,  3-4 days a week.  Eat a low-fat diet with lots of fruits and vegetables, up to 7-9 servings per day. Consume less than 40 grams of sugar per day from foods & drinks with High Fructose Corn Sugar as #1,2,3 or # 4 on label. Health Care Power of Attorney & Living Will. Complete if not in place ; these place you in charge of your health care decisions. Blood Pressure Goal  Ideally is an AVERAGE < 135/85. This AVERAGE should be calculated from @ least 5-7 BP readings taken @ different times of day on different days of week. You should not respond to isolated BP readings , but rather the AVERAGE for that week . After review of labs the need for cholesterol medication  will be determined. LDL goal = < 160, ideally < 120 (Note:present dose = Crestor 20 mg ONCE/ week)

## 2010-12-28 NOTE — Progress Notes (Signed)
Subjective:    Patient ID: BLANE WORTHINGTON, male    DOB: [birth year], 70 y.o.   MRN: [id number]  HPI Medicare Wellness Visit:  The following psychosocial & medical history were reviewed as required by Medicare.   Social history: caffeine: 2-3 cups coffee/ day , alcohol:  occasionally ,  tobacco use : never  & exercise : walking 5-7 X/ week for 30  min.   Home & personal  safety / fall risk: no issues, activities of daily living: no limitations , seatbelt use : yes , and smoke alarm employment : yes .  Power of Attorney/Living Will status : ? In place  Vision ( as recorded per Nurse) & Hearing  evaluation :  Seen annually for  Ophth exam, last  01/2010 for Fuch's Dystrophy;.wall chart read with lenses & whisper heard @ 6 ft Orientation :oriented X 3 , memory & recall :good , spelling or math testing: good,and mood & affect : normal . Depression / anxiety: denied Travel history : 2002 [place] , immunization status :yes , transfusion history:  Autologous post prostatectomy 1998, and preventive health surveillance ( colonoscopies, BMD , etc as per protocol/ SOC): up to date, Dental care:  seen every 6 mos . Chart reviewed &  Updated. Active issues reviewed & addressed.       Review of Systems Patient reports no  vision/ hearing changes,anorexia, weight change, fever ,adenopathy, persistant / recurrent hoarseness, swallowing issues, chest pain,palpitations, edema,persistant / recurrent cough, hemoptysis, dyspnea(rest, exertional, paroxysmal nocturnal), gastrointestinal  bleeding (melena, rectal bleeding), abdominal pain, excessive heart burn, GU symptoms( dysuria, hematuria, pyuria, voiding issues) syncope, focal weakness, memory loss,numbness & tingling, skin/hair/nail changes,abnormal bruising/bleeding, musculoskeletal symptoms/signs.  Minor incontinence since 1998 surgery     Objective:   Physical Exam Gen.: Healthy and well-nourished in appearance. Alert, appropriate and cooperative throughout  exam. Head: Normocephalic without obvious abnormalities;  no alopecia ; goatee Eyes: No corneal or conjunctival inflammation noted. Pupils equal round reactive to light and accommodation. Fundal exam is benign without hemorrhages, exudate, papilledema. Extraocular motion intact.  Ears: External  ear exam reveals no significant lesions or deformities. Canals clear .TMs normal.  Nose: External nasal exam reveals no deformity or inflammation. Nasal mucosa are pink and moist. No lesions or exudates noted. Septum  normal  Mouth: Oral mucosa and oropharynx reveal no lesions or exudates. Teeth in good repair. Neck: No deformities, masses, or tenderness noted. Range of motion & . Thyroid normal. Lungs: Normal respiratory effort; chest expands symmetrically. Lungs are clear to auscultation without rales, wheezes, or increased work of breathing. Heart: Normal rate and rhythm. Normal S1 and S2. No gallop, click, or rub. S4 ; no  murmur. Abdomen: Bowel sounds normal; abdomen soft and nontender. No masses, organomegaly or hernias noted. Genitalia: Small varices on the left. DRE deferred   .                                                                                   Musculoskeletal/extremities: No deformity or scoliosis noted of  the thoracic or lumbar spine. No clubbing, cyanosis, edema, or deformity noted. Range of motion  normal .Tone & strength  normal.Joints normal. Nail  health  good. Vascular: Carotid, radial artery, dorsalis pedis and  posterior tibial pulses are full and equal. No bruits present. Neurologic: Alert and oriented x3. Deep tendon reflexes symmetrical and normal.          Skin: Intact without suspicious lesions or rashes.Scattered lipomata Lymph: No cervical, axillary, or inguinal lymphadenopathy present. Psych: Mood and affect are normal. Normally interactive                                                                                         Assessment & Plan:  #1 Medicare  Wellness Exam; criteria met ; data entered #2 Problem List reviewed ; Assessment/ Recommendations made Plan: see Orders

## 2010-12-29 ENCOUNTER — Encounter: Payer: Self-pay | Admitting: Internal Medicine

## 2011-01-22 ENCOUNTER — Telehealth: Payer: Self-pay | Admitting: Internal Medicine

## 2011-01-22 NOTE — Telephone Encounter (Signed)
Spoke with patient, patient questioned when should he recheck CBCD/b12/folate/iron. Scheduled appointment for next Tuesday (Lab)

## 2011-01-24 ENCOUNTER — Other Ambulatory Visit (INDEPENDENT_AMBULATORY_CARE_PROVIDER_SITE_OTHER): Payer: PRIVATE HEALTH INSURANCE

## 2011-01-24 DIAGNOSIS — Z1211 Encounter for screening for malignant neoplasm of colon: Secondary | ICD-10-CM

## 2011-01-24 LAB — HEMOCCULT GUIAC POC 1CARD (OFFICE)
Card #3 Fecal Occult Blood, POC: NEGATIVE
Fecal Occult Blood, POC: NEGATIVE

## 2011-01-24 NOTE — Progress Notes (Signed)
Labs only

## 2011-01-28 ENCOUNTER — Other Ambulatory Visit: Payer: Self-pay | Admitting: Internal Medicine

## 2011-01-28 DIAGNOSIS — D649 Anemia, unspecified: Secondary | ICD-10-CM

## 2011-01-29 ENCOUNTER — Other Ambulatory Visit (INDEPENDENT_AMBULATORY_CARE_PROVIDER_SITE_OTHER): Payer: Medicare Other

## 2011-01-29 DIAGNOSIS — D649 Anemia, unspecified: Secondary | ICD-10-CM

## 2011-01-29 LAB — CBC WITH DIFFERENTIAL/PLATELET
Basophils Absolute: 0 10*3/uL (ref 0.0–0.1)
HCT: 38.4 % — ABNORMAL LOW (ref 39.0–52.0)
Lymphs Abs: 1.8 10*3/uL (ref 0.7–4.0)
MCV: 85.5 fl (ref 78.0–100.0)
Monocytes Absolute: 0.5 10*3/uL (ref 0.1–1.0)
Platelets: 213 10*3/uL (ref 150.0–400.0)
RDW: 15.5 % — ABNORMAL HIGH (ref 11.5–14.6)

## 2011-01-29 LAB — FERRITIN: Ferritin: 5.6 ng/mL — ABNORMAL LOW (ref 22.0–322.0)

## 2011-01-29 NOTE — Progress Notes (Signed)
Labs only

## 2011-02-26 ENCOUNTER — Ambulatory Visit (INDEPENDENT_AMBULATORY_CARE_PROVIDER_SITE_OTHER): Payer: Medicare Other

## 2011-02-26 DIAGNOSIS — Z23 Encounter for immunization: Secondary | ICD-10-CM

## 2011-05-06 ENCOUNTER — Other Ambulatory Visit: Payer: Self-pay | Admitting: Internal Medicine

## 2011-05-06 DIAGNOSIS — E785 Hyperlipidemia, unspecified: Secondary | ICD-10-CM

## 2011-05-08 ENCOUNTER — Other Ambulatory Visit (INDEPENDENT_AMBULATORY_CARE_PROVIDER_SITE_OTHER): Payer: Medicare Other

## 2011-05-08 DIAGNOSIS — E785 Hyperlipidemia, unspecified: Secondary | ICD-10-CM

## 2011-05-08 LAB — LIPID PANEL: Total CHOL/HDL Ratio: 4

## 2011-05-08 LAB — LDL CHOLESTEROL, DIRECT: Direct LDL: 143.8 mg/dL

## 2011-05-08 NOTE — Progress Notes (Signed)
12  

## 2011-05-17 ENCOUNTER — Encounter: Payer: Self-pay | Admitting: Internal Medicine

## 2012-01-13 ENCOUNTER — Ambulatory Visit (INDEPENDENT_AMBULATORY_CARE_PROVIDER_SITE_OTHER): Payer: Medicare Other | Admitting: Internal Medicine

## 2012-01-13 ENCOUNTER — Encounter: Payer: Self-pay | Admitting: Internal Medicine

## 2012-01-13 VITALS — BP 130/82 | HR 58 | Temp 98.1°F | Resp 12 | Ht 71.03 in | Wt 155.6 lb

## 2012-01-13 DIAGNOSIS — Z8546 Personal history of malignant neoplasm of prostate: Secondary | ICD-10-CM

## 2012-01-13 DIAGNOSIS — Z0000 Encounter for general adult medical examination without abnormal findings: Secondary | ICD-10-CM

## 2012-01-13 DIAGNOSIS — E785 Hyperlipidemia, unspecified: Secondary | ICD-10-CM

## 2012-01-13 DIAGNOSIS — I10 Essential (primary) hypertension: Secondary | ICD-10-CM

## 2012-01-13 DIAGNOSIS — D649 Anemia, unspecified: Secondary | ICD-10-CM

## 2012-01-13 LAB — HEPATIC FUNCTION PANEL: Albumin: 3.9 g/dL (ref 3.5–5.2)

## 2012-01-13 LAB — BASIC METABOLIC PANEL
CO2: 28 mEq/L (ref 19–32)
Calcium: 9.3 mg/dL (ref 8.4–10.5)
Glucose, Bld: 100 mg/dL — ABNORMAL HIGH (ref 70–99)
Sodium: 139 mEq/L (ref 135–145)

## 2012-01-13 LAB — LIPID PANEL
Cholesterol: 158 mg/dL (ref 0–200)
HDL: 47 mg/dL (ref >39.00)
VLDL: 8.6 mg/dL (ref 0.0–40.0)

## 2012-01-13 LAB — CBC WITH DIFFERENTIAL/PLATELET
Basophils Absolute: 0.1 10*3/uL (ref 0.0–0.1)
Eosinophils Absolute: 0.1 10*3/uL (ref 0.0–0.7)
HCT: 43.3 % (ref 39.0–52.0)
Hemoglobin: 14.4 g/dL (ref 13.0–17.0)
Lymphs Abs: 1.4 10*3/uL (ref 0.7–4.0)
MCHC: 33.2 g/dL (ref 30.0–36.0)
Neutro Abs: 2.9 10*3/uL (ref 1.4–7.7)
RDW: 13.6 % (ref 11.5–14.6)

## 2012-01-13 MED ORDER — LOSARTAN POTASSIUM 50 MG PO TABS: 50.0000 mg | ORAL_TABLET | Freq: Every day | ORAL | Status: DC

## 2012-01-13 NOTE — Progress Notes (Signed)
Subjective:    Patient ID: Ronald Ross, male    DOB: 12-27-1940, 71 y.o.   MRN: [id number]  HPI Medicare Wellness Visit:  The following psychosocial & medical history were reviewed as required by Medicare.   Social history: caffeine: 2 cups / day , alcohol:  5 shots/ week ,  tobacco use : never  & exercise : walking 15 min daily.   Home & personal  safety / fall risk: minimal issue of imbalance, activities of daily living: no limitations , seatbelt use : yes , and smoke alarm employment : yes .  Power of Attorney/Living Will status :in place  Vision ( as recorded per Nurse) & Hearing  evaluation :  Ophth seen annually; no hearing exam. Orientation :oriented X 3 , memory & recall :good,  math testing: good,and mood & affect : normal . Depression / anxiety: denied Travel history : last 2003 [place] , immunization status : Flu pending , transfusion history: autologous transfusion with prostatectomy, and preventive health surveillance ( colonoscopies, BMD , etc as per protocol/ [facility]): colonoscopy as per Dr Russella Dar, Dental care:  Seen every 6 mos . Chart reviewed &  Updated. Active issues reviewed & addressed.       Review of Systems HYPERTENSION: Disease Monitoring: Blood pressure range-130/80   Chest pain, palpitations- no       Dyspnea- no Medications: Compliance-yes  Lightheadedness,Syncope- no    Edema- no  HYPERLIPIDEMIA: Disease Monitoring: See symptoms for Hypertension Medications:Compliance- off statin based on NMR Lipoprofile  until Crestor 20 mg once weekly restarted  ANEMIA, PMH of : Dyspepsia/dysphagia: no Melena/BRBPR:no Hematemesis: no  Anorexia:no Wt loss:10 # with better diet & exercise NSAIDs/ASA: no              Objective:   Physical Exam Gen.: Thin but healthy and well-nourished in appearance. Alert, appropriate and cooperative throughout exam. Head: Normocephalic without obvious abnormalities;  no alopecia . Goatee Eyes: No corneal or conjunctival  inflammation noted. Pupils equal round reactive to light and accommodation.  Extraocular motion intact. Vision grossly normal with lenses. Ears: External  ear exam reveals no significant lesions or deformities. Canals clear .TMs normal. Hearing is grossly normal bilaterally. Nose: External nasal exam reveals no deformity or inflammation. Nasal mucosa are pink and moist. No lesions or exudates noted.  Mouth: Oral mucosa and oropharynx reveal no lesions or exudates. Teeth in good repair. Neck: No deformities, masses, or tenderness noted. Range of motion & Thyroid normal Lungs: Normal respiratory effort; chest expands symmetrically. Lungs are clear to auscultation without rales, wheezes, or increased work of breathing. Heart: Normal rate and rhythm. Normal S1 and S2. No gallop, click, or rub. S4 with slurring at  LSB . Abdomen: Bowel sounds normal; abdomen soft and nontender. No masses, organomegaly or hernias noted. Genitalia: DRE deferred. Genitalia normal except for left varices                                                                  Musculoskeletal/extremities: Slight lordosis noted of  The mid thoracic or lumba spine. No clubbing, cyanosis, edema, or deformity noted. Range of motion  normal .Tone & strength  Normal.Joints: minor DJD changes . Nail health  good. Vascular: Carotid, radial artery, dorsalis pedis and  posterior  tibial pulses are full and equal. No bruits present. Neurologic: Alert and oriented x3. Deep tendon reflexes symmetrical and normal.          Skin: Intact without suspicious lesions or rashes. Lipoma RUE Lymph: No cervical, axillary, or inguinal lymphadenopathy present. Psych: Mood and affect are normal. Normally interactive                                                                                         Assessment & Plan:  #1 Medicare Wellness Exam; criteria met ; data entered #2 Problem List reviewed ; Assessment/ Recommendations made Plan: see Orders

## 2012-01-13 NOTE — Patient Instructions (Addendum)
Preventive Health Care: Exercise at least 30-45 minutes a day,  3-4 days a week.  Eat a low-fat diet with lots of fruits and vegetables, up to 7-9 servings per day.  Consume less than 40 grams of sugar per day from foods & drinks with High Fructose Corn Sugar as # 1,2,3 or # 4 on label. Blood Pressure Goal  Ideally is an AVERAGE < 135/85. This AVERAGE should be calculated from @ least 5-7 BP readings taken @ different times of day on different days of week. You should not respond to isolated BP readings , but rather the AVERAGE for that week . Review and correct the record as indicated. Please share record with all medical staff seen.   If you are on  My Chart; the results can be released to you as soon as they populate from the lab.

## 2012-01-14 ENCOUNTER — Encounter: Payer: Self-pay | Admitting: Internal Medicine

## 2012-06-14 ENCOUNTER — Encounter: Payer: Self-pay | Admitting: Internal Medicine

## 2012-06-20 ENCOUNTER — Other Ambulatory Visit: Payer: Self-pay

## 2012-07-27 ENCOUNTER — Encounter: Payer: Self-pay | Admitting: Internal Medicine

## 2012-07-27 ENCOUNTER — Other Ambulatory Visit: Payer: Self-pay

## 2012-07-27 MED ORDER — ROSUVASTATIN CALCIUM 20 MG PO TABS: ORAL_TABLET | ORAL | Status: DC

## 2012-07-27 NOTE — Telephone Encounter (Signed)
Per Mychart request, per Dr.Hopper change rx from 1 by mouth weekly to 1 by mouth M/F

## 2012-09-22 ENCOUNTER — Encounter: Payer: Self-pay | Admitting: Gastroenterology

## 2012-10-02 ENCOUNTER — Telehealth: Payer: Self-pay | Admitting: Internal Medicine

## 2012-10-02 NOTE — Telephone Encounter (Signed)
Patient called to schedule lab appt for 10/16/12 to recheck cholesterol levels. Can you place orders please?

## 2012-10-02 NOTE — Telephone Encounter (Signed)
Left message on voicemail for patient to return call when available. Reason for call last cholesterol check was 01/2012, there was no indication to check labs in June, patient to call back to discuss. (Labs not due 01/2013)

## 2012-10-16 ENCOUNTER — Other Ambulatory Visit (INDEPENDENT_AMBULATORY_CARE_PROVIDER_SITE_OTHER): Payer: Medicare Other

## 2012-10-16 DIAGNOSIS — E785 Hyperlipidemia, unspecified: Secondary | ICD-10-CM

## 2012-10-16 LAB — LIPID PANEL
HDL: 51 mg/dL (ref >39.00)
LDL Cholesterol: 87 mg/dL (ref 0–99)
Total CHOL/HDL Ratio: 3
Triglycerides: 36 mg/dL (ref 0.0–149.0)

## 2013-01-15 ENCOUNTER — Encounter: Payer: Self-pay | Admitting: Internal Medicine

## 2013-01-15 ENCOUNTER — Ambulatory Visit (INDEPENDENT_AMBULATORY_CARE_PROVIDER_SITE_OTHER): Payer: Medicare Other | Admitting: Internal Medicine

## 2013-01-15 VITALS — BP 150/76 | HR 57 | Temp 98.5°F | Ht 70.75 in | Wt 170.2 lb

## 2013-01-15 DIAGNOSIS — D649 Anemia, unspecified: Secondary | ICD-10-CM

## 2013-01-15 DIAGNOSIS — Z8546 Personal history of malignant neoplasm of prostate: Secondary | ICD-10-CM

## 2013-01-15 DIAGNOSIS — E785 Hyperlipidemia, unspecified: Secondary | ICD-10-CM

## 2013-01-15 DIAGNOSIS — Z0000 Encounter for general adult medical examination without abnormal findings: Secondary | ICD-10-CM

## 2013-01-15 DIAGNOSIS — I10 Essential (primary) hypertension: Secondary | ICD-10-CM

## 2013-01-15 LAB — CBC WITH DIFFERENTIAL/PLATELET
Eosinophils Relative: 3.4 % (ref 0.0–5.0)
HCT: 43 % (ref 39.0–52.0)
Hemoglobin: 14.6 g/dL (ref 13.0–17.0)
Lymphocytes Relative: 31.9 % (ref 12.0–46.0)
Lymphs Abs: 1.9 10*3/uL (ref 0.7–4.0)
Monocytes Relative: 7.1 % (ref 3.0–12.0)
Platelets: 180 10*3/uL (ref 150.0–400.0)
WBC: 6.1 10*3/uL (ref 4.5–10.5)

## 2013-01-15 LAB — HEPATIC FUNCTION PANEL
ALT: 22 U/L (ref 0–53)
AST: 21 U/L (ref 0–37)
Alkaline Phosphatase: 48 U/L (ref 39–117)
Total Bilirubin: 0.8 mg/dL (ref 0.3–1.2)

## 2013-01-15 LAB — TSH: TSH: 2.18 u[IU]/mL (ref 0.35–5.50)

## 2013-01-15 LAB — BASIC METABOLIC PANEL
BUN: 21 mg/dL (ref 6–23)
GFR: 59.18 mL/min — ABNORMAL LOW (ref >60.00)
Potassium: 4.3 mEq/L (ref 3.5–5.1)
Sodium: 139 mEq/L (ref 135–145)

## 2013-01-15 MED ORDER — LOSARTAN POTASSIUM 50 MG PO TABS: 50.0000 mg | ORAL_TABLET | Freq: Every day | ORAL | Status: DC

## 2013-01-15 NOTE — Progress Notes (Signed)
Subjective:    Patient ID: Ronald Ross, male    DOB: 04/26/1941, 72 y.o.   MRN: [id number]  HPI Medicare Wellness Visit:  Psychosocial & medical history were reviewed as required by Medicare (abuse,antisocial behavioral risks,firearm risk).  Social history: caffeine:2 cups / day  , alcohol: 2 drinks / week   ,  tobacco use:   never Exercise :  See below Home & personal  safety / fall risk:no Limitations of activities of daily living:no Seatbelt  and smoke alarm use:yes Power of Attorney/Living Will status : in place Ophthalmology exam status :current Hearing evaluation status:not current Orientation :oriented X 3 Memory & recall :good Spelling testing:good Active depression / anxiety:denied Foreign travel history : [place] 2004 Immunization status for Shingles /Flu/ PNA/ tetanus :Flu needed Transfusion history:  Post TURP ( autologous ) Preventive health surveillance status of colonoscopy as per protocol/ SOC: ? due Dental care:  Every 6 mos Chart reviewed &  Updated. Active issues reviewed & addressed.      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 Specifically he denies chest pain, palpitations, dyspnea, or claudication.  Family history is negative for premature coronary disease. Advanced cholesterol testing reveals his LDL goal is less than 160. There is medication compliance with the statin; Crestor 20 mg 2 X/week. Significant abdominal symptoms, memory deficit, or myalgias denied. BP not @  Home ( see BP today of 150/76).     Objective:   Physical Exam Gen.: Thin but healthy and well-nourished in appearance. Alert, appropriate and cooperative throughout exam.Appears younger than stated age  Head: Normocephalic without obvious abnormalities; goatee  Eyes: No corneal or conjunctival inflammation noted. Pupils equal round reactive to light and accommodation.  Extraocular motion  intact. Vision grossly decreased even with lenses Ears: External  ear exam reveals no significant lesions or deformities. Canals clear .TMs normal. Hearing is grossly normal bilaterally. Nose: External nasal exam reveals no deformity or inflammation. Nasal mucosa are pink and moist. No lesions or exudates noted.   Mouth: Oral mucosa and oropharynx reveal no lesions or exudates. Teeth in good repair. Neck: No deformities, masses, or tenderness noted. Range of motion & Thyroid normal. Lungs: Normal respiratory effort; chest expands symmetrically. Lungs are clear to auscultation without rales, wheezes, or increased work of breathing. Heart: Normal rate and rhythm. Normal S1 and S2. No gallop, click, or rub.S4 w/o murmur. Abdomen: Bowel sounds normal; abdomen soft and nontender. No masses, organomegaly or hernias noted. Genitalia: S/P TURP                                Musculoskeletal/extremities:  There is slight asymmetry of the posterior thoracic musculature suggesting occult scoliosis. No clubbing, cyanosis, edema, or significant extremity  deformity noted. Range of motion normal .Tone & strength  Normal. Joints normal . Nail health good. Able to lie down & sit up w/o help. Negative SLR bilaterally Vascular: Carotid, radial artery, dorsalis pedis and  posterior tibial pulses are full and equal. No bruits present. Neurologic: Alert and oriented x3. Deep tendon reflexes symmetrical and normal.          Skin: Intact without suspicious lesions or rashes. Scattered lipomata. Resolving ecchymoses left forearm. Lymph: No cervical, axillary lymphadenopathy present. Psych: Mood and affect are normal. Normally interactive  Assessment & Plan:  #1 Medicare Wellness Exam; criteria met ; data entered #2 Problem List/Diagnoses reviewed Plan:  Assessments made/ Orders entered  

## 2013-01-15 NOTE — Patient Instructions (Addendum)
Share results with all non [facility] medical staff seen  

## 2013-02-09 ENCOUNTER — Ambulatory Visit (INDEPENDENT_AMBULATORY_CARE_PROVIDER_SITE_OTHER): Payer: Medicare Other

## 2013-02-09 DIAGNOSIS — Z23 Encounter for immunization: Secondary | ICD-10-CM

## 2014-01-17 ENCOUNTER — Ambulatory Visit (INDEPENDENT_AMBULATORY_CARE_PROVIDER_SITE_OTHER): Payer: Medicare HMO | Admitting: Internal Medicine

## 2014-01-17 ENCOUNTER — Encounter: Payer: Self-pay | Admitting: Internal Medicine

## 2014-01-17 ENCOUNTER — Other Ambulatory Visit (INDEPENDENT_AMBULATORY_CARE_PROVIDER_SITE_OTHER): Payer: Medicare HMO

## 2014-01-17 VITALS — BP 124/80 | HR 52 | Temp 98.4°F | Resp 13 | Ht 71.0 in | Wt 159.5 lb

## 2014-01-17 DIAGNOSIS — Z8546 Personal history of malignant neoplasm of prostate: Secondary | ICD-10-CM

## 2014-01-17 DIAGNOSIS — D649 Anemia, unspecified: Secondary | ICD-10-CM

## 2014-01-17 DIAGNOSIS — I10 Essential (primary) hypertension: Secondary | ICD-10-CM

## 2014-01-17 DIAGNOSIS — K635 Polyp of colon: Secondary | ICD-10-CM

## 2014-01-17 DIAGNOSIS — D126 Benign neoplasm of colon, unspecified: Secondary | ICD-10-CM

## 2014-01-17 DIAGNOSIS — E785 Hyperlipidemia, unspecified: Secondary | ICD-10-CM

## 2014-01-17 LAB — BASIC METABOLIC PANEL
BUN: 16 mg/dL (ref 6–23)
CHLORIDE: 106 meq/L (ref 96–112)
CO2: 28 meq/L (ref 19–32)
CREATININE: 1.2 mg/dL (ref 0.4–1.5)
Calcium: 9.4 mg/dL (ref 8.4–10.5)
GFR: 64.88 mL/min (ref >60.00)
GLUCOSE: 90 mg/dL (ref 70–99)
POTASSIUM: 5.4 meq/L — AB (ref 3.5–5.1)
Sodium: 142 mEq/L (ref 135–145)

## 2014-01-17 LAB — CBC WITH DIFFERENTIAL/PLATELET
BASOS ABS: 0 10*3/uL (ref 0.0–0.1)
Basophils Relative: 0.5 % (ref 0.0–3.0)
EOS ABS: 0.2 10*3/uL (ref 0.0–0.7)
Eosinophils Relative: 2.5 % (ref 0.0–5.0)
HEMATOCRIT: 41.3 % (ref 39.0–52.0)
Hemoglobin: 14 g/dL (ref 13.0–17.0)
Lymphocytes Relative: 26.9 % (ref 12.0–46.0)
Lymphs Abs: 1.7 10*3/uL (ref 0.7–4.0)
MCHC: 33.8 g/dL (ref 30.0–36.0)
MCV: 94.3 fl (ref 78.0–100.0)
MONO ABS: 0.4 10*3/uL (ref 0.1–1.0)
Monocytes Relative: 6.5 % (ref 3.0–12.0)
NEUTROS PCT: 63.6 % (ref 43.0–77.0)
Neutro Abs: 4 10*3/uL (ref 1.4–7.7)
PLATELETS: 220 10*3/uL (ref 150.0–400.0)
RBC: 4.38 Mil/uL (ref 4.22–5.81)
RDW: 12.8 % (ref 11.5–15.5)
WBC: 6.3 10*3/uL (ref 4.0–10.5)

## 2014-01-17 LAB — LIPID PANEL
CHOL/HDL RATIO: 3
Cholesterol: 142 mg/dL (ref 0–200)
HDL: 49.1 mg/dL (ref >39.00)
LDL CALC: 87 mg/dL (ref 0–99)
NonHDL: 92.9
TRIGLYCERIDES: 31 mg/dL (ref 0.0–149.0)
VLDL: 6.2 mg/dL (ref 0.0–40.0)

## 2014-01-17 LAB — HEPATIC FUNCTION PANEL
ALT: 17 U/L (ref 0–53)
AST: 20 U/L (ref 0–37)
Albumin: 3.6 g/dL (ref 3.5–5.2)
Alkaline Phosphatase: 52 U/L (ref 39–117)
BILIRUBIN DIRECT: 0.1 mg/dL (ref 0.0–0.3)
BILIRUBIN TOTAL: 0.5 mg/dL (ref 0.2–1.2)
Total Protein: 6.5 g/dL (ref 6.0–8.3)

## 2014-01-17 LAB — TSH: TSH: 2.68 u[IU]/mL (ref 0.35–4.50)

## 2014-01-17 LAB — PSA: PSA: 0 ng/mL — ABNORMAL LOW (ref 0.10–4.00)

## 2014-01-17 LAB — FERRITIN: Ferritin: 9.4 ng/mL — ABNORMAL LOW (ref 22.0–322.0)

## 2014-01-17 MED ORDER — LOSARTAN POTASSIUM 50 MG PO TABS: 50.0000 mg | ORAL_TABLET | Freq: Every day | ORAL | Status: DC

## 2014-01-17 MED ORDER — ROSUVASTATIN CALCIUM 20 MG PO TABS: ORAL_TABLET | ORAL | Status: DC

## 2014-01-17 NOTE — Patient Instructions (Addendum)
Minimal Blood Pressure Goal= AVERAGE < 140/90;  Ideal is an AVERAGE < 135/85. This AVERAGE should be calculated from @ least 5-7 BP readings taken @ different times of day on different days of week. You should not respond to isolated BP readings , but rather the AVERAGE for that week .Please bring your  blood pressure cuff to office visits to verify that it is reliable.It  can also be checked against the blood pressure device at the pharmacy. Finger or wrist cuffs are not dependable; an arm cuff is.   Your next office appointment will be determined based upon review of your pending labs . Those instructions will be transmitted to you through My Chart .  Alternatives to screening colonoscopy include virtual colonoscopy as well as new genetic stool studies such as [employer]. The best option for you will be discussed with gastroenterology.

## 2014-01-17 NOTE — Progress Notes (Signed)
   Subjective:    Patient ID: Ronald Ross, male    DOB: 03-Sep-1940, 73 y.o.   MRN: [id number]  HPI Nicole Kindred is here to assess active health issues & conditions.  PMH, FH, & Social history verified & updated . A heart healthy diet is followed; exercise encompasses 20-30 minutes 6  times per week as  Walking & weights without symptoms.   Advanced cholesterol testing reveals  LDL goal is less than 160 ; ideally < 130 . There is medication compliance with the statin.  Low dose ASA taken  No BP monitor @ home.  Review of Systems  Specifically denied are  chest pain, palpitations, dyspnea, or claudication.  Significant abdominal symptoms, memory deficit, or myalgias not present.  Rare nocturnal leg cramps.       Objective:   Physical Exam  Gen.: Thin but healthy and well-nourished in appearance. Alert, appropriate and cooperative throughout exam. Appears younger than stated age  Head: Normocephalic without obvious abnormalities;  no alopecia .Goatee Eyes: No corneal or conjunctival inflammation noted. Pupils equal round reactive to light and accommodation. Extraocular motion intact. Fundal exam is benign without hemorrhages, exudate, papilledema.  Vision grossly normal with /w/o lenses Ears: External  ear exam reveals no significant lesions or deformities. Canals clear .TMs normal. Hearing is grossly normal bilaterally. Nose: External nasal exam reveals no deformity or inflammation. Nasal mucosa are pink and moist. No lesions or exudates noted.   Mouth: Oral mucosa and oropharynx reveal no lesions or exudates. Teeth in good repair. Neck: No deformities, masses, or tenderness noted. Range of motion &. Thyroid normal.. Lungs: Normal respiratory effort; chest expands symmetrically. Lungs are clear to auscultation without rales, wheezes, or increased work of breathing. Heart: Slow rate and regular rhythm. Normal S1 and S2. No gallop, click, or rub. Grade 1/2 systolic  murmur. Abdomen: Bowel  sounds normal; abdomen soft and nontender. No masses, organomegaly or hernias noted. Genitalia: PSA monitor                          Musculoskeletal/extremities: No deformity or scoliosis noted of  the thoracic or lumbar spine.   No clubbing, cyanosis, edema, or significant extremity  deformity noted. Range of motion normal .Tone & strength normal. Hand joints reveal mnor  DJD DIP changes.  Fingernail health good. Able to lie down & sit up w/o help. Negative SLR bilaterally Vascular: Carotid, radial artery, dorsalis pedis and  posterior tibial pulses are full and equal. No bruits present. Neurologic: Alert and oriented x3. Deep tendon reflexes symmetrical and normal.  Gait normal.      Skin: Intact without suspicious lesions or rashes.Scattered SQ lipomata Lymph: No cervical, axillary lymphadenopathy present. Psych: Mood and affect are normal. Normally interactive                                                                                       Assessment & Plan:  See Current Assessment & Plan in Problem List under specific Diagnosis

## 2014-01-17 NOTE — Assessment & Plan Note (Signed)
PSA

## 2014-01-17 NOTE — Progress Notes (Signed)
Pre visit review using our clinic review tool, if applicable. No additional management support is needed unless otherwise documented below in the visit note. 

## 2014-01-17 NOTE — Assessment & Plan Note (Signed)
CBC, ferritin 

## 2014-01-17 NOTE — Assessment & Plan Note (Signed)
Blood pressure goals reviewed. BMET 

## 2014-01-17 NOTE — Assessment & Plan Note (Signed)
Lipids, LFTs, TSH  

## 2014-02-17 ENCOUNTER — Ambulatory Visit: Payer: Medicare HMO

## 2014-06-25 ENCOUNTER — Encounter: Payer: Self-pay | Admitting: Gastroenterology

## 2014-10-31 ENCOUNTER — Other Ambulatory Visit: Payer: Self-pay

## 2015-01-03 ENCOUNTER — Telehealth: Payer: Self-pay

## 2015-01-03 NOTE — Telephone Encounter (Signed)
Call to introduce AWV to see if the patient can come prior to Annual CPE on 9/15 at 9:30am with Dr. Linna Darner. Apt has to be made on a different day than the apt date. If this is not possible, he will fup with Dr. Linna Darner for AWV and CPE this year.

## 2015-01-18 ENCOUNTER — Ambulatory Visit (INDEPENDENT_AMBULATORY_CARE_PROVIDER_SITE_OTHER): Payer: Medicare HMO

## 2015-01-18 VITALS — BP 124/70 | HR 69 | Ht 71.0 in | Wt 175.5 lb

## 2015-01-18 DIAGNOSIS — Z23 Encounter for immunization: Secondary | ICD-10-CM | POA: Diagnosis not present

## 2015-01-18 DIAGNOSIS — Z0000 Encounter for general adult medical examination without abnormal findings: Secondary | ICD-10-CM

## 2015-01-18 NOTE — Progress Notes (Signed)
Medical screening examination/treatment/procedure(s) were performed by non-physician practitioner and as supervising physician I was immediately available for consultation/collaboration. I agree with above. William Hopper, MD   

## 2015-01-18 NOTE — Patient Instructions (Addendum)
Ronald Ross , Thank you for taking time to come for your Medicare Wellness Visit. I appreciate your ongoing commitment to your health goals. Please review the following plan we discussed and let me know if I can assist you in the future.   Will consider tai chi for improved balance Will take prevnar today;  Will defer flu vaccine for now; perhaps to get the high does in a couple of weeks Will discuss balance with dr. Linna Darner; ? innner ear or other BP med  Will discuss GI colonoscopy vs other with Dr. Linna Darner Will also discuss nasal spray for allergies or other    [facility] Address: [address] [place], [place] [address]  [phone]  Off crestor; will fup on diet considering lipids results   These are the goals we discussed: Goals    . balance     Recommended tai chi; [facility] in [place] at [place]; [place]       This is a list of the screening recommended for you and due dates:  Health Maintenance  Topic Date Due  . Colon Cancer Screening  08/26/1990  . Shingles Vaccine  08/25/2000  . Pneumonia vaccines (2 of 2 - PCV13) 10/27/2010  . Flu Shot  12/05/2014  . Tetanus Vaccine  10/20/2019     Health Maintenance A healthy lifestyle and preventative care can promote health and wellness.  Maintain regular health, dental, and eye exams.  Eat a healthy diet. Foods like vegetables, fruits, whole grains, low-fat dairy products, and lean protein foods contain the nutrients you need and are low in calories. Decrease your intake of foods high in solid fats, added sugars, and salt. Get information about a proper diet from your health care provider, if necessary.  Regular physical exercise is one of the most important things you can do for your health. Most adults should get at least 150 minutes of moderate-intensity exercise (any activity that increases your heart rate and causes you to sweat) each week. In addition, most adults need  muscle-strengthening exercises on 2 or more days a week.   Maintain a healthy weight. The body mass index (BMI) is a screening tool to identify possible weight problems. It provides an estimate of body fat based on height and weight. Your health care provider can find your BMI and can help you achieve or maintain a healthy weight. For males 20 years and older:  A BMI below 18.5 is considered underweight.  A BMI of 18.5 to 24.9 is normal.  A BMI of 25 to 29.9 is considered overweight.  A BMI of 30 and above is considered obese.  Maintain normal blood lipids and cholesterol by exercising and minimizing your intake of saturated fat. Eat a balanced diet with plenty of fruits and vegetables. Blood tests for lipids and cholesterol should begin at age 76 and be repeated every 5 years. If your lipid or cholesterol levels are high, you are over age 32, or you are at high risk for heart disease, you may need your cholesterol levels checked more frequently.Ongoing high lipid and cholesterol levels should be treated with medicines if diet and exercise are not working.  If you smoke, find out from your health care provider how to quit. If you do not use tobacco, do not start.  Lung cancer screening is recommended for adults aged 30-80 years who are at high risk for developing lung cancer because of a history of smoking. A yearly low-dose CT scan of the  lungs is recommended for people who have at least a 30-pack-year history of smoking and are current smokers or have quit within the past 15 years. A pack year of smoking is smoking an average of 1 pack of cigarettes a day for 1 year (for example, a 30-pack-year history of smoking could mean smoking 1 pack a day for 30 years or 2 packs a day for 15 years). Yearly screening should continue until the smoker has stopped smoking for at least 15 years. Yearly screening should be stopped for people who develop a health problem that would prevent them from having lung  cancer treatment.  If you choose to drink alcohol, do not have more than 2 drinks per day. One drink is considered to be 12 oz (360 mL) of beer, 5 oz (150 mL) of wine, or 1.5 oz (45 mL) of liquor.  Avoid the use of street drugs. Do not share needles with anyone. Ask for help if you need support or instructions about stopping the use of drugs.  High blood pressure causes heart disease and increases the risk of stroke. Blood pressure should be checked at least every 1-2 years. Ongoing high blood pressure should be treated with medicines if weight loss and exercise are not effective.  If you are 59-24 years old, ask your health care provider if you should take aspirin to prevent heart disease.  Diabetes screening involves taking a blood sample to check your fasting blood sugar level. This should be done once every 3 years after age 26 if you are at a normal weight and without risk factors for diabetes. Testing should be considered at a younger age or be carried out more frequently if you are overweight and have at least 1 risk factor for diabetes.  Colorectal cancer can be detected and often prevented. Most routine colorectal cancer screening begins at the age of 79 and continues through age 38. However, your health care provider may recommend screening at an earlier age if you have risk factors for colon cancer. On a yearly basis, your health care provider may provide home test kits to check for hidden blood in the stool. A small camera at the end of a tube may be used to directly examine the colon (sigmoidoscopy or colonoscopy) to detect the earliest forms of colorectal cancer. Talk to your health care provider about this at age 38 when routine screening begins. A direct exam of the colon should be repeated every 5-10 years through age 80, unless early forms of precancerous polyps or small growths are found.  People who are at an increased risk for hepatitis B should be screened for this virus. You are  considered at high risk for hepatitis B if:  You were born in a country where hepatitis B occurs often. Talk with your health care provider about which countries are considered high risk.  Your parents were born in a high-risk country and you have not received a shot to protect against hepatitis B (hepatitis B vaccine).  You have HIV or AIDS.  You use needles to inject street drugs.  You live with, or have sex with, someone who has hepatitis B.  You are a man who has sex with other men (MSM).  You get hemodialysis treatment.  You take certain medicines for conditions like cancer, organ transplantation, and autoimmune conditions.  Hepatitis C blood testing is recommended for all people born from [birth year] through 1965 and any individual with known risk factors for hepatitis C.  Healthy  men should no longer receive prostate-specific antigen (PSA) blood tests as part of routine cancer screening. Talk to your health care provider about prostate cancer screening.  Testicular cancer screening is not recommended for adolescents or adult males who have no symptoms. Screening includes self-exam, a health care provider exam, and other screening tests. Consult with your health care provider about any symptoms you have or any concerns you have about testicular cancer.  Practice safe sex. Use condoms and avoid high-risk sexual practices to reduce the spread of sexually transmitted infections (STIs).  You should be screened for STIs, including gonorrhea and chlamydia if:  You are sexually active and are younger than 24 years.  You are older than 24 years, and your health care provider tells you that you are at risk for this type of infection.  Your sexual activity has changed since you were last screened, and you are at an increased risk for chlamydia or gonorrhea. Ask your health care provider if you are at risk.  If you are at risk of being infected with HIV, it is recommended that you take a  prescription medicine daily to prevent HIV infection. This is called pre-exposure prophylaxis (PrEP). You are considered at risk if:  You are a man who has sex with other men (MSM).  You are a heterosexual man who is sexually active with multiple partners.  You take drugs by injection.  You are sexually active with a partner who has HIV.  Talk with your health care provider about whether you are at high risk of being infected with HIV. If you choose to begin PrEP, you should first be tested for HIV. You should then be tested every 3 months for as long as you are taking PrEP.  Use sunscreen. Apply sunscreen liberally and repeatedly throughout the day. You should seek shade when your shadow is shorter than you. Protect yourself by wearing long sleeves, pants, a wide-brimmed hat, and sunglasses year round whenever you are outdoors.  Tell your health care provider of new moles or changes in moles, especially if there is a change in shape or color. Also, tell your health care provider if a mole is larger than the size of a pencil eraser.  A one-time screening for abdominal aortic aneurysm (AAA) and surgical repair of large AAAs by ultrasound is recommended for men aged 14-75 years who are current or former smokers.  Stay current with your vaccines (immunizations). Document Released: 10/19/2007 Document Revised: 04/27/2013 Document Reviewed: 09/17/2010 [facility] Patient Information 2015 [place], [place]. This information is not intended to replace advice given to you by your health care provider. Make sure you discuss any questions you have with your health care provider.

## 2015-01-18 NOTE — Progress Notes (Signed)
Subjective:   Ronald Ross is a 74 y.o. male who presents for Medicare Annual/Subsequent preventive examination.  Review of Systems:  HRA assessment completed during visit;  Patient is here for Annual Wellness Assessment The Patient was informed that this wellness visit is to identify risk and educate on how to reduce risk for increase disease through lifestyle changes.   ROS deferred to CPE exam with physician  BMI: BMI 24.3 / reviewed lipids Diet; breakfast:  coffee x 2 and bread with olive oil; lunch banana with peanut butter The patient cooks; go out to eat salads; [employer]; rice and beans; spaghetti with tofu;  Weight is up some in the summer; 160 between and 175;   Exercise; walk every day; 15 minutes; little weight lifting at home Taking up tennis x 2 a week;   Sister had lung cancer Dad lung cancer but smoked  SAFETY Live alone; wife expired x 2 years ago; participate in singing groups;  Choral society;  One level home; condo has a flight of stairs; mountain home has hillside; He has not problem Balance is not great; but may be related to bp med; sodium;  Plan is to age in place; Agrees to go to assisted living;  Safety reviewed for the home; including removal of clutter; clear paths through the home, eliminating clutter, railing as needed; bathroom safety; community safety; smoke detectors and firearms safety as well as sun protection; Generally "covers up" when out Driving accidents; no concerns and seatbelt  Stressors;  no stressors;   Medication review: states he came of crestor x 1 year as he is trying to manage his diet;  Eating fiber; limits red meat intake; don't eat steak  Will discuss with Dr. Linna Darner tomorrow  Fall assessment; no just lost balance at times; will fup post exam for etiology but has not fallen but notices his balance is not as good  Mobilization and Functional losses in the last year. none  Urinary or fecal incontinence reviewed; partially  incont secondary to prostate 1998    Counseling: Colonoscopy; the last one is poor; dr. Fuller Plan has sent a letter; Educated regarding virtual and colo guard States he had tortuous bowel and may meet criteria for Virtual colonoscopy;  EKG 01/15/2013 Hearing: left ear '[id number]'   Ophthalmology exam; Fuch's dystrophy; Inner layer of the cornea; born with few cells are they die off;  [facility] dx; Very sensitive to glare; light; s/p both len's removed and replaced;  Fuch's dystrophy is stable at present; Probably will never need transplant; No other issues; Very near sighted; Read as long as the light is sufficient   Immunizations Due  Shingles and the patient has had this  when he was 47;  Prevnar 36 will take today Prefers high dose;   Current Care Team reviewed and updated Dr. Patrice Paradise; [facility] Dr. Fuller Plan Dr, Sue Lush; retired; doesn't see a urologist now    Objective:    Vitals: BP 124/70 mmHg  Pulse 69  Ht '5\' 11"'  (1.803 m)  Wt 175 lb 8 oz (79.606 kg)  BMI 24.49 kg/m2  SpO2 96%  Tobacco History  Smoking status  . Never Smoker   Smokeless tobacco  . Not on file     Counseling given: Yes   Past Medical History  Diagnosis Date  . Hyperlipidemia   . Hypertension   . Personal history of prostate cancer     Dr. Serita Butcher  . Fuchs' corneal dystrophy     Dr. Patrice Paradise;  [facility] , [place]  . Anemia     blood donor   Past Surgical History  Procedure Laterality Date  . Prostate surgery  1998    prostatectomy  . Eye surgery  1999 & 2010    cataract & implant bilaterally  . Colonoscopy  2009    Dr Fuller Plan, virtual colonoscopy recommended  in 2013 because of tortuous colon  . Colonoscopy with polypectomy  2003   Family History  Problem Relation Age of Onset  . Hypertension Mother   . Heart attack Mother     in 56s  . Atrial fibrillation Mother     Aortic Stenosis  . Anemia Mother     ? slow GI bleed  . Lung cancer Father     smoker  . Breast cancer Sister     . Dementia Paternal Grandmother   . Stroke Neg Hx   . Diabetes Neg Hx    History  Sexual Activity  . Sexual Activity: Not on file    Outpatient Encounter Prescriptions as of 01/18/2015  Medication Sig  . aspirin 81 MG tablet Take 81 mg by mouth daily.    . Ferrous Sulfate (IRON) 325 (65 FE) MG TABS Take by mouth daily.  Marland Kitchen loratadine (CLARITIN) 10 MG tablet Take 10 mg by mouth daily.    Marland Kitchen losartan (COZAAR) 50 MG tablet Take 1 tablet (50 mg total) by mouth daily.  . Multiple Vitamin (MULTIVITAMIN) tablet Take 1 tablet by mouth daily.    . Omega-3 Fatty Acids (FISH OIL) 1000 MG CAPS Take by mouth 2 (two) times daily.    . rosuvastatin (CRESTOR) 20 MG tablet One by mouth on Mon and Fri (Patient not taking: Reported on 01/18/2015)   No facility-administered encounter medications on file as of 01/18/2015.    Activities of Daily Living In your present state of health, do you have any difficulty performing the following activities: 01/18/2015  Hearing? N  Vision? N  Difficulty concentrating or making decisions? N  Walking or climbing stairs? N  Dressing or bathing? N  Doing errands, shopping? N  Preparing Food and eating ? N  Using the Toilet? N  In the past six months, have you accidently leaked urine? (No Data)  Do you have problems with loss of bowel control? N  Managing your Medications? N  Managing your Finances? N  Housekeeping or managing your Housekeeping? N    Patient Care Team: Hendricks Limes, MD as PCP - General   Assessment:    Assessment   Today patient counseled on age appropriate routine health concerns for screening and prevention, educated on virtual colonoscopy which the patient asked about; Explained that sometimes not covered by medicare unless there is an anatomical issue or obstruction with bowel; Also will discuss colo-guard with Dr. Linna Darner; Educated on vaccines; had sh[facility]les at [facility] when he was 19; Had Prevnar today; Would prefer high does and will come in  to get flu shot when high does is replenished  or will take regular flu shot tomorrow.    Labs deferred for CPE or medical fup by MD.  Risk factors for depression reviewed and negative. Hearing function and visual acuity reviewed and has some vision loss but has been stable and coping at present/  ADLs screened and addressed as needed. Functional ability and level of safety reviewed and appropriate. Will discuss loss ob balance with Dr Linna Darner tomorrow. Educated on memory loss and AD8 score was 0.  Education, counseling and referrals  performed based on assessed risks today. Patient provided with a copy of personalized plan for preventive services and due dates  FALL PREVENTION Educated on prevention falls; Exercise, toning and strengthening which he does at home; has started playing tennis x 2 days per week which assist with aerobic exercise; referred to [place] for tai chi if interested for exercise   HEPATITIS SCREEN deferred today  TOBACCO/ETOH or other DRUG use was negative  Reviewed lipids and the patient stopped his crestor x 1 year; will discuss with Dr. Linna Darner tomorrow at [place] managed at present; A and O; good socialization; Sings in choral group; plays tennis and in choir at church as well.      Exercise Activities and Dietary recommendations Current Exercise Habits:: Home exercise routine, Type of exercise: walking, Time (Minutes): 30 (more if he plays tennis), Frequency (Times/Week): 5, Weekly Exercise (Minutes/Week): 150, Intensity: Moderate  Goals    . balance     Recommended tai chi; [place] in [place]; [place] Risk  01/18/2015 01/15/2013  Falls in the past year? No No   Depression Screen PHQ 2/9 Scores 01/18/2015 01/15/2013  PHQ - 2 Score 0 0    Cognitive Testing No flowsheet data found.  Immunization History  Administered Date(s) Administered  . Influenza Split 02/26/2011  . Influenza Whole 03/15/2002,  02/07/2007, 02/10/2008, 02/07/2009, 02/22/2010  . Influenza, High Dose Seasonal PF 02/09/2013  . Pneumococcal Polysaccharide-23 10/26/2009  . Td 10/19/2009   Screening Tests Health Maintenance  Topic Date Due  . COLONOSCOPY  08/26/1990  . ZOSTAVAX  08/25/2000  . PNA vac Low Risk Adult (2 of 2 - PCV13) 10/27/2010  . INFLUENZA VACCINE  12/05/2014  . TETANUS/TDAP  10/20/2019      Plan:    Will consider tai chi for improved balance Will take prevnar today;  Will defer flu vaccine for now; perhaps to get the high does in a couple of weeks Will discuss balance with dr. Linna Darner; ? innner ear or other BP med  Will discuss GI colonoscopy vs other with Dr. Linna Darner Will also discuss nasal spray for allergies or other    [facility] [address], [place], [place] [address]  [phone]  Off crestor; will fup on diet considering lipids results    During the course of the visit the patient was educated and counseled about the following appropriate screening and preventive services:   Vaccines to include Pneumoccal, Influenza, Hepatitis B, Td, Zostavax, HCV  Electrocardiogram 01/2013  Cardiovascular Disease / none identified; will check lipids; stopped crestor; BP good; BMI normal  Colorectal cancer screening; due and will discuss with Dr. Linna Darner fup; ? Virtual or colo-guard; states the last colonoscopy approx 74 yo was not a good experience  Diabetes screening/ na  Glaucoma screening/ annual visits   Nutrition counseling / Reviewed diet and good fats; does not eat a lot of red meat; eats tofu; beans etc;  Smoking cessation counseling/ n/a  Patient Instructions (the written plan) was given to the patient.    Wynetta Fines, RN  01/18/2015

## 2015-01-19 ENCOUNTER — Other Ambulatory Visit (INDEPENDENT_AMBULATORY_CARE_PROVIDER_SITE_OTHER): Payer: Medicare HMO

## 2015-01-19 ENCOUNTER — Ambulatory Visit (INDEPENDENT_AMBULATORY_CARE_PROVIDER_SITE_OTHER): Payer: Medicare HMO | Admitting: Internal Medicine

## 2015-01-19 ENCOUNTER — Encounter: Payer: Self-pay | Admitting: Internal Medicine

## 2015-01-19 VITALS — BP 126/72 | HR 54 | Temp 98.5°F | Resp 16 | Ht 71.0 in | Wt 174.0 lb

## 2015-01-19 DIAGNOSIS — I10 Essential (primary) hypertension: Secondary | ICD-10-CM

## 2015-01-19 DIAGNOSIS — K635 Polyp of colon: Secondary | ICD-10-CM

## 2015-01-19 DIAGNOSIS — Z8546 Personal history of malignant neoplasm of prostate: Secondary | ICD-10-CM

## 2015-01-19 DIAGNOSIS — E785 Hyperlipidemia, unspecified: Secondary | ICD-10-CM

## 2015-01-19 DIAGNOSIS — D126 Benign neoplasm of colon, unspecified: Secondary | ICD-10-CM

## 2015-01-19 LAB — CBC WITH DIFFERENTIAL/PLATELET
BASOS PCT: 0.3 % (ref 0.0–3.0)
Basophils Absolute: 0 10*3/uL (ref 0.0–0.1)
EOS PCT: 2 % (ref 0.0–5.0)
Eosinophils Absolute: 0.2 10*3/uL (ref 0.0–0.7)
HCT: 46.2 % (ref 39.0–52.0)
Hemoglobin: 15.6 g/dL (ref 13.0–17.0)
LYMPHS ABS: 2 10*3/uL (ref 0.7–4.0)
Lymphocytes Relative: 23 % (ref 12.0–46.0)
MCHC: 33.6 g/dL (ref 30.0–36.0)
MCV: 94.6 fl (ref 78.0–100.0)
MONO ABS: 0.6 10*3/uL (ref 0.1–1.0)
Monocytes Relative: 6.8 % (ref 3.0–12.0)
NEUTROS PCT: 67.9 % (ref 43.0–77.0)
Neutro Abs: 6 10*3/uL (ref 1.4–7.7)
Platelets: 234 10*3/uL (ref 150.0–400.0)
RBC: 4.89 Mil/uL (ref 4.22–5.81)
RDW: 13.2 % (ref 11.5–15.5)
WBC: 8.9 10*3/uL (ref 4.0–10.5)

## 2015-01-19 LAB — HEPATIC FUNCTION PANEL
ALT: 20 U/L (ref 0–53)
AST: 19 U/L (ref 0–37)
Albumin: 4 g/dL (ref 3.5–5.2)
Alkaline Phosphatase: 69 U/L (ref 39–117)
BILIRUBIN TOTAL: 0.6 mg/dL (ref 0.2–1.2)
Bilirubin, Direct: 0.1 mg/dL (ref 0.0–0.3)
Total Protein: 6.7 g/dL (ref 6.0–8.3)

## 2015-01-19 LAB — PSA: PSA: 0 ng/mL — ABNORMAL LOW (ref 0.10–4.00)

## 2015-01-19 LAB — LIPID PANEL
CHOLESTEROL: 232 mg/dL — AB (ref 0–200)
HDL: 55.8 mg/dL (ref >39.00)
LDL CALC: 157 mg/dL — AB (ref 0–99)
NonHDL: 176.65
TRIGLYCERIDES: 97 mg/dL (ref 0.0–149.0)
Total CHOL/HDL Ratio: 4
VLDL: 19.4 mg/dL (ref 0.0–40.0)

## 2015-01-19 LAB — BASIC METABOLIC PANEL
BUN: 17 mg/dL (ref 6–23)
CHLORIDE: 103 meq/L (ref 96–112)
CO2: 30 mEq/L (ref 19–32)
Calcium: 9.9 mg/dL (ref 8.4–10.5)
Creatinine, Ser: 1.22 mg/dL (ref 0.40–1.50)
GFR: 61.65 mL/min (ref >60.00)
GLUCOSE: 102 mg/dL — AB (ref 70–99)
POTASSIUM: 5.2 meq/L — AB (ref 3.5–5.1)
Sodium: 140 mEq/L (ref 135–145)

## 2015-01-19 LAB — TSH: TSH: 3.65 u[IU]/mL (ref 0.35–4.50)

## 2015-01-19 NOTE — Progress Notes (Signed)
Pre visit review using our clinic review tool, if applicable. No additional management support is needed unless otherwise documented below in the visit note. 

## 2015-01-19 NOTE — Assessment & Plan Note (Signed)
Blood pressure goals reviewed. BMET 

## 2015-01-19 NOTE — Assessment & Plan Note (Signed)
PSA

## 2015-01-19 NOTE — Assessment & Plan Note (Signed)
Lipids, LFTs, TSH  

## 2015-01-19 NOTE — Progress Notes (Signed)
   Subjective:    Patient ID: Ronald Ross, male    DOB: 03-16-1941, 74 y.o.   MRN: [id number]  HPI The patient is here to assess status of active health conditions.  PMH, FH, & Social History reviewed & updated.No change in [place] as recorded.  He is on a heart healthy, low-salt diet. He exercises 20-30 minutes a day as walking and tennis. He also lifts weights. He has no associated cardio pulmonary symptoms.  He has been compliant with his blood pressure medicine. He is not checking his blood pressures.   He stopped his Crestor 6 months ago wishing to assess the impact of nutrition and exercise on his lipids.  His colonoscopy was last performed 2009 but was terminated because of a tortuous colon and poor prep. He has a history of polyps in 2003. His only active GI symptoms  is persistently thin stools.  Review of Systems  Chest pain, palpitations, tachycardia, exertional dyspnea, paroxysmal nocturnal dyspnea, claudication or edema are absent. No unexplained weight loss, abdominal pain, significant dyspepsia, dysphagia, melena, or rectal bleeding. Dysuria, pyuria, hematuria, frequency, nocturia or polyuria are denied. Change in hair, skin, nails denied. No bowel changes of constipation or diarrhea. No intolerance to heat or cold.     Objective:   Physical Exam  Pertinent or positive findings include: Appears younger than his stated age. He has a Engineering geologist. Heart rhythm is slow with an increased S2. He has a boss of the forehead and  lipoma over the right forearm. Deep tendon reflexes are 0-1/2+. Slight crepitus of knees. Varices are noted in the left scrotum. Prostate is surgically absent. The rectal vault is widely patent without any constriction.  General appearance :adequately nourished; in no distress.  Eyes: No conjunctival inflammation or scleral icterus is present.  Oral exam:  Lips and gums are healthy appearing.There is no oropharyngeal erythema or exudate noted. Dental hygiene  is good.  Heart:   regular rhythm. S1  normal without gallop, murmur, click, rub or other extra sounds    Lungs:Chest clear to auscultation; no wheezes, rhonchi,rales ,or rubs present.No increased work of breathing.   Abdomen: bowel sounds normal, soft and non-tender without masses, organomegaly or hernias noted.  No guarding or rebound. No flank tenderness to percussion.  Vascular : all pulses equal ; no bruits present.  Skin:Warm & dry.  Intact without suspicious lesions or rashes ; no tenting or jaundice   Lymphatic: No lymphadenopathy is noted about the head, neck, axilla, or inguinal areas.   Neuro: Strength, tone  normal.     Assessment & Plan:  See Current Assessment & Plan in Problem List under specific Diagnosis

## 2015-01-19 NOTE — Patient Instructions (Signed)
Minimal Blood Pressure Goal= AVERAGE < 140/90;  Ideal is an AVERAGE < 135/85. This AVERAGE should be calculated from @ least 5-7 BP readings taken @ different times of day on different days of week. You should not respond to isolated BP readings , but rather the AVERAGE for that week .Please bring your  blood pressure cuff to office visits to verify that it is reliable.It  can also be checked against the blood pressure device at the pharmacy. Finger or wrist cuffs are not dependable; an arm cuff is. Your next office appointment will be determined based upon review of your pending labs .  Those written interpretation of the lab results and instructions will be transmitted to you by My Chart   Critical results will be called.   Followup as needed for any active or acute issue. Please report any significant change in your symptoms. 

## 2015-01-19 NOTE — Assessment & Plan Note (Addendum)
CBC and differential  GI consult to discuss best option to evaluate history of colon polyps in the context of the tortuous colon

## 2015-01-23 ENCOUNTER — Other Ambulatory Visit (INDEPENDENT_AMBULATORY_CARE_PROVIDER_SITE_OTHER): Payer: Medicare HMO

## 2015-01-23 ENCOUNTER — Telehealth: Payer: Self-pay | Admitting: Internal Medicine

## 2015-01-23 ENCOUNTER — Other Ambulatory Visit: Payer: Self-pay | Admitting: Emergency Medicine

## 2015-01-23 DIAGNOSIS — I10 Essential (primary) hypertension: Secondary | ICD-10-CM

## 2015-01-23 DIAGNOSIS — R7309 Other abnormal glucose: Secondary | ICD-10-CM | POA: Diagnosis not present

## 2015-01-23 LAB — HEMOGLOBIN A1C: HEMOGLOBIN A1C: 5.4 % (ref 4.6–6.5)

## 2015-01-23 MED ORDER — LOSARTAN POTASSIUM 50 MG PO TABS: 50.0000 mg | ORAL_TABLET | Freq: Every day | ORAL | Status: DC

## 2015-01-23 NOTE — Telephone Encounter (Signed)
Refill has been sent.  °

## 2015-01-23 NOTE — Telephone Encounter (Signed)
Pt called in and said that he did not get his refill for losartan (COZAAR) 50 MG tablet [[id number]]    It was suppose to be sent to [facility] on file

## 2015-01-25 ENCOUNTER — Telehealth: Payer: Self-pay

## 2015-01-25 NOTE — Telephone Encounter (Signed)
Call to the patient to fup on flu, as he did not get this when seeing Dr. Linna Darner; States he still prefers the high does, but also prefers to wait until oct; He is in [place] now and stated that pharmacies there will have the high does flu shot and to request they send this information to [place] when he has one.

## 2015-02-08 DIAGNOSIS — Z23 Encounter for immunization: Secondary | ICD-10-CM | POA: Diagnosis not present

## 2015-03-20 DIAGNOSIS — H04123 Dry eye syndrome of bilateral lacrimal glands: Secondary | ICD-10-CM | POA: Diagnosis not present

## 2015-03-20 DIAGNOSIS — Z961 Presence of intraocular lens: Secondary | ICD-10-CM | POA: Diagnosis not present

## 2015-03-20 DIAGNOSIS — H26491 Other secondary cataract, right eye: Secondary | ICD-10-CM | POA: Diagnosis not present

## 2015-03-20 DIAGNOSIS — H1851 Endothelial corneal dystrophy: Secondary | ICD-10-CM | POA: Diagnosis not present

## 2015-04-18 ENCOUNTER — Telehealth: Payer: Self-pay

## 2015-04-18 NOTE — Telephone Encounter (Signed)
Left Voice Mail for pt to call back.   RE: Flu Vaccine for 2016  

## 2015-05-17 ENCOUNTER — Encounter: Payer: Self-pay | Admitting: Internal Medicine

## 2015-05-24 ENCOUNTER — Ambulatory Visit (INDEPENDENT_AMBULATORY_CARE_PROVIDER_SITE_OTHER): Payer: Medicare HMO | Admitting: Gastroenterology

## 2015-05-24 ENCOUNTER — Encounter: Payer: Self-pay | Admitting: Gastroenterology

## 2015-05-24 VITALS — BP 122/70 | HR 64 | Ht 71.0 in | Wt 173.4 lb

## 2015-05-24 DIAGNOSIS — Z1211 Encounter for screening for malignant neoplasm of colon: Secondary | ICD-10-CM | POA: Diagnosis not present

## 2015-05-24 DIAGNOSIS — R933 Abnormal findings on diagnostic imaging of other parts of digestive tract: Secondary | ICD-10-CM

## 2015-05-24 NOTE — Patient Instructions (Signed)
We will send you a letter closer to 09/2017 for your Cologuard recall.  Thank you for choosing me and [facility].  Pricilla Riffle. Dagoberto Ligas., MD., Marval Regal

## 2015-05-24 NOTE — Progress Notes (Signed)
    History of Present Illness: This is a 75 year old male referred by Hendricks Limes, MD for [facility] screening.  Previously underwent colonoscopy in May 2009 which was complete to the hepatic flexure. He had a tortuous, redundant sigmoid colon and a poor prep. ACBE performed subsequent to colonoscopy as below. He has no GI complaints and wishes to discuss CRC screening options. Denies weight loss, abdominal pain, constipation, diarrhea, change in stool caliber, melena, hematochezia, nausea, vomiting, dysphagia, reflux symptoms, chest pain.  ACBE IMPRESSION 09/2007:  1. Limited double contrast enema due to a redundant colon, and a large, capacious and atonic sigmoid colon. The sigmoid colon has a a very smooth segment with loss of haustral markings. Question if there is a history of chronic constipation. 2. It is difficult to exclude polyps in the region of the cecum or ascending colon. 3. No large mass, stricture or obstruction is identified  Review of Systems: Pertinent positive and negative review of systems were noted in the above HPI section. All other review of systems were otherwise negative.  Current Medications, Allergies, Past Medical History, Past Surgical History, Family History and Social History were reviewed in [employer] record.  Physical Exam: General: Well developed, well nourished, no acute distress Head: Normocephalic and atraumatic Eyes:  sclerae anicteric, EOMI Ears: Normal auditory acuity Mouth: No deformity or lesions Neck: Supple, no masses or thyromegaly Lungs: Clear throughout to auscultation Heart: Regular rate and rhythm; no murmurs, rubs or bruits Abdomen: Soft, non tender and non distended. No masses, hepatosplenomegaly or hernias noted. Normal Bowel sounds Musculoskeletal: Symmetrical with no gross deformities  Skin: No lesions on visible extremities Pulses:  Normal pulses noted Extremities: No clubbing, cyanosis, edema or  deformities noted Neurological: Alert oriented x 4, grossly nonfocal Cervical Nodes:  No significant cervical adenopathy Inguinal Nodes: No significant inguinal adenopathy Psychological:  Alert and cooperative. Normal mood and affect  Assessment and Recommendations:  1. CRC screening, average risk. History of prior incomplete colonoscopy with a poor prep in 09/2007. Subsequent ACBE had a dilated and redundant sigmoid colon with no polyps noted. The cecum and ascending colon were not optimally visualized on ACBE and were not visualized on colonoscopy. Since colonoscopy and ACBE were less than optimal we discussed Cologuard for CRC screening now or at the full 10 year interval. He wants to go the full 10 years so a recall entered for 09/2017 for Cologuard.     cc: Hendricks Limes, MD [address] [place], [place] [address]

## 2015-06-26 ENCOUNTER — Encounter: Payer: Self-pay | Admitting: Gastroenterology

## 2016-01-21 NOTE — Patient Instructions (Addendum)
[name] , Thank you for taking time to come for your Medicare Wellness Visit. I appreciate your ongoing commitment to your health goals. Please review the following plan we discussed and let me know if I can assist you in the future.   These are the goals we discussed: Goals    . none            This is a list of the screening recommended for you and due dates:  Health Maintenance  Topic Date Due  . Colon Cancer Screening  09/17/2017  . Tetanus Vaccine  10/20/2019  . Flu Shot  Addressed  . Shingles Vaccine  Completed  . Pneumonia vaccines  Completed    Test(s) ordered today. Your results will be released to [place] (or called to you) after review, usually within 72hours after test completion. If any changes need to be made, you will be notified at that same time.  All other Health Maintenance issues reviewed.   All recommended immunizations and age-appropriate screenings are up-to-date or discussed.  Flu vaccine administered today.   Medications reviewed and updated.  No changes recommended at this time.   Please followup in one year   Health Maintenance, Male A healthy lifestyle and preventative care can promote health and wellness.  Maintain regular health, dental, and eye exams.  Eat a healthy diet. Foods like vegetables, fruits, whole grains, low-fat dairy products, and lean protein foods contain the nutrients you need and are low in calories. Decrease your intake of foods high in solid fats, added sugars, and salt. Get information about a proper diet from your health care provider, if necessary.  Regular physical exercise is one of the most important things you can do for your health. Most adults should get at least 150 minutes of moderate-intensity exercise (any activity that increases your heart rate and causes you to sweat) each week. In addition, most adults need muscle-strengthening exercises on 2 or more days a week.   Maintain a healthy weight. The body mass  index (BMI) is a screening tool to identify possible weight problems. It provides an estimate of body fat based on height and weight. Your health care provider can find your BMI and can help you achieve or maintain a healthy weight. For males 20 years and older:  A BMI below 18.5 is considered underweight.  A BMI of 18.5 to 24.9 is normal.  A BMI of 25 to 29.9 is considered overweight.  A BMI of 30 and above is considered obese.  Maintain normal blood lipids and cholesterol by exercising and minimizing your intake of saturated fat. Eat a balanced diet with plenty of fruits and vegetables. Blood tests for lipids and cholesterol should begin at age 66 and be repeated every 5 years. If your lipid or cholesterol levels are high, you are over age 42, or you are at high risk for heart disease, you may need your cholesterol levels checked more frequently.Ongoing high lipid and cholesterol levels should be treated with medicines if diet and exercise are not working.  If you smoke, find out from your health care provider how to quit. If you do not use tobacco, do not start.  Lung cancer screening is recommended for adults aged 77-80 years who are at high risk for developing lung cancer because of a history of smoking. A yearly low-dose CT scan of the lungs is recommended for people who have at least a 30-pack-year history of smoking and are current smokers or have quit within  the past 15 years. A pack year of smoking is smoking an average of 1 pack of cigarettes a day for 1 year (for example, a 30-pack-year history of smoking could mean smoking 1 pack a day for 30 years or 2 packs a day for 15 years). Yearly screening should continue until the smoker has stopped smoking for at least 15 years. Yearly screening should be stopped for people who develop a health problem that would prevent them from having lung cancer treatment.  If you choose to drink alcohol, do not have more than 2 drinks per day. One drink is  considered to be 12 oz (360 mL) of beer, 5 oz (150 mL) of wine, or 1.5 oz (45 mL) of liquor.  Avoid the use of street drugs. Do not share needles with anyone. Ask for help if you need support or instructions about stopping the use of drugs.  High blood pressure causes heart disease and increases the risk of stroke. High blood pressure is more likely to develop in:  People who have blood pressure in the end of the normal range (100-139/85-89 mm Hg).  People who are overweight or obese.  People who are African American.  If you are 46-84 years of age, have your blood pressure checked every 3-5 years. If you are 41 years of age or older, have your blood pressure checked every year. You should have your blood pressure measured twice--once when you are at a hospital or clinic, and once when you are not at a hospital or clinic. Record the average of the two measurements. To check your blood pressure when you are not at a hospital or clinic, you can use:  An automated blood pressure machine at a pharmacy.  A home blood pressure monitor.  If you are 14-25 years old, ask your health care provider if you should take aspirin to prevent heart disease.  Diabetes screening involves taking a blood sample to check your fasting blood sugar level. This should be done once every 3 years after age 73 if you are at a normal weight and without risk factors for diabetes. Testing should be considered at a younger age or be carried out more frequently if you are overweight and have at least 1 risk factor for diabetes.  Colorectal cancer can be detected and often prevented. Most routine colorectal cancer screening begins at the age of 50 and continues through age 62. However, your health care provider may recommend screening at an earlier age if you have risk factors for colon cancer. On a yearly basis, your health care provider may provide home test kits to check for hidden blood in the stool. A small camera at the end  of a tube may be used to directly examine the colon (sigmoidoscopy or colonoscopy) to detect the earliest forms of colorectal cancer. Talk to your health care provider about this at age 39 when routine screening begins. A direct exam of the colon should be repeated every 5-10 years through age 30, unless early forms of precancerous polyps or small growths are found.  People who are at an increased risk for hepatitis B should be screened for this virus. You are considered at high risk for hepatitis B if:  You were born in a country where hepatitis B occurs often. Talk with your health care provider about which countries are considered high risk.  Your parents were born in a high-risk country and you have not received a shot to protect against hepatitis B (hepatitis  B vaccine).  You have HIV or AIDS.  You use needles to inject street drugs.  You live with, or have sex with, someone who has hepatitis B.  You are a man who has sex with other men (MSM).  You get hemodialysis treatment.  You take certain medicines for conditions like cancer, organ transplantation, and autoimmune conditions.  Hepatitis C blood testing is recommended for all people born from [birth year] through 1965 and any individual with known risk factors for hepatitis C.  Healthy men should no longer receive prostate-specific antigen (PSA) blood tests as part of routine cancer screening. Talk to your health care provider about prostate cancer screening.  Testicular cancer screening is not recommended for adolescents or adult males who have no symptoms. Screening includes self-exam, a health care provider exam, and other screening tests. Consult with your health care provider about any symptoms you have or any concerns you have about testicular cancer.  Practice safe sex. Use condoms and avoid high-risk sexual practices to reduce the spread of sexually transmitted infections (STIs).  You should be screened for STIs, including  gonorrhea and chlamydia if:  You are sexually active and are younger than 24 years.  You are older than 24 years, and your health care provider tells you that you are at risk for this type of infection.  Your sexual activity has changed since you were last screened, and you are at an increased risk for chlamydia or gonorrhea. Ask your health care provider if you are at risk.  If you are at risk of being infected with HIV, it is recommended that you take a prescription medicine daily to prevent HIV infection. This is called pre-exposure prophylaxis (PrEP). You are considered at risk if:  You are a man who has sex with other men (MSM).  You are a heterosexual man who is sexually active with multiple partners.  You take drugs by injection.  You are sexually active with a partner who has HIV.  Talk with your health care provider about whether you are at high risk of being infected with HIV. If you choose to begin PrEP, you should first be tested for HIV. You should then be tested every 3 months for as long as you are taking PrEP.  Use sunscreen. Apply sunscreen liberally and repeatedly throughout the day. You should seek shade when your shadow is shorter than you. Protect yourself by wearing long sleeves, pants, a wide-brimmed hat, and sunglasses year round whenever you are outdoors.  Tell your health care provider of new moles or changes in moles, especially if there is a change in shape or color. Also, tell your health care provider if a mole is larger than the size of a pencil eraser.  A one-time screening for abdominal aortic aneurysm (AAA) and surgical repair of large AAAs by ultrasound is recommended for men aged 57-75 years who are current or former smokers.  Stay current with your vaccines (immunizations).   This information is not intended to replace advice given to you by your health care provider. Make sure you discuss any questions you have with your health care provider.    Document Released: 10/19/2007 Document Revised: 05/13/2014 Document Reviewed: 09/17/2010 Elsevier Interactive Patient Education [employer].

## 2016-01-21 NOTE — Progress Notes (Signed)
Subjective:    Patient ID: Ronald Ross, male    DOB: 1941/02/04, 75 y.o.   MRN: [id number]  HPI Here for medicare wellness exam and an annual physical exam.   I have personally reviewed and have noted 1.The patient's medical and social history 2.Their use of alcohol, tobacco or illicit drugs 3.Their current medications and supplements 4.The patient's functional ability including ADL's, fall risks, home safety risks and hearing or visual impairment. 5.Diet and physical activities 6.Evidence for depression or mood disorders 7.Care team reviewed and updated - Eye doctor - Dr Larence Penning, dentist   Are there smokers in your home (other than you)? No  Risk Factors Exercise: walking 20 minutes daily, calistenics Dietary issues discussed:  Well balanced - will try to decrease carbohydrates  Cardiac risk factors: advanced age, hypertension, hyperlipidemia  Depression Screen  Have you felt down, depressed or hopeless? No  Have you felt little interest or pleasure in doing things?  No  Activities of Daily Living In your present state of health, do you have any difficulty performing the following activities?:  Driving? No Managing money?  No Feeding yourself? No Getting from bed to chair? No Climbing a flight of stairs? No Preparing food and eating?: No Bathing or showering? No Getting dressed: No Getting to/using the toilet? No Moving around from place to place: No In the past year have you fallen or had a near fall?: No   Are you sexually active?  No  Do you have more than one partner?  N/A  Hearing Difficulties: No Do you often ask people to speak up or repeat themselves? No Do you experience ringing or noises in your ears? No Do you have difficulty understanding soft or whispered voices? No Vision:              Any change in vision:  no             Up to date with eye exam:  Up to date  Memory:  Do you  feel that you have a problem with memory? No, just recall  Do you often misplace items? No  Do you feel safe at home?  Yes  Cognitive Testing  Alert, Orientated? Yes  Normal Appearance? Yes  Recall of three objects?  Yes  Can perform simple calculations? Yes  Displays appropriate judgment? Yes  Can read the correct time from a watch face? Yes   Advanced Directives have been discussed with the patient? Yes, in place   Medications and allergies reviewed with patient and updated if appropriate.  Patient Active Problem List   Diagnosis Date Noted  . Polyp of colon, hyperplastic 01/17/2014  . ANEMIA, MILD 04/08/2008  . METHICILLIN RESISTANT STAPHYLOCOCCUS AUREUS INFECTION 06/24/2006  . LIPOMAS, MULTIPLE 06/24/2006  . Hyperlipidemia 06/24/2006  . Essential hypertension 06/24/2006  . PROSTATE CANCER, HX OF 06/24/2006    Current Outpatient Prescriptions on File Prior to Visit  Medication Sig Dispense Refill  . aspirin 81 MG tablet Take 81 mg by mouth daily.      . Ferrous Sulfate (IRON) 325 (65 FE) MG TABS Take by mouth daily.    Marland Kitchen loratadine (CLARITIN) 10 MG tablet Take 10 mg by mouth daily.      Marland Kitchen losartan (COZAAR) 50 MG tablet Take 1 tablet (50 mg total) by mouth daily. 90 tablet 3  . Multiple Vitamin (MULTIVITAMIN) tablet Take 1 tablet by mouth daily.      . Omega-3 Fatty Acids (FISH OIL) 1000 MG  CAPS Take by mouth 2 (two) times daily.       No current facility-administered medications on file prior to visit.     Past Medical History:  Diagnosis Date  . Anemia    blood donor  . Fuchs' corneal dystrophy    Dr. Patrice Paradise; [facility] , [place]  . Hyperlipidemia   . Hypertension   . Personal history of prostate cancer    Dr. Serita Butcher    Past Surgical History:  Procedure Laterality Date  . COLONOSCOPY  2009   Dr Fuller Plan, virtual colonoscopy recommended  in 2013 because of tortuous colon  . colonoscopy with polypectomy  2003  . [place] 2010   cataract & implant  bilaterally  . PROSTATE SURGERY  1998   prostatectomy    Social History   Social History  . Marital status: Married    Spouse name: N/A  . Number of children: 1  . Years of education: N/A   Occupational History  . Retired    Social History Main Topics  . Smoking status: Never Smoker  . Smokeless tobacco: Never Used  . Alcohol use 0.0 oz/week     Comment:  seldom  . Drug use: No  . Sexual activity: Not on file   Other Topics Concern  . Not on file   Social History Narrative  . No narrative on file    Family History  Problem Relation Age of Onset  . Hypertension Mother   . Heart attack Mother     in 55s  . Atrial fibrillation Mother     Aortic Stenosis  . Anemia Mother     ? slow GI bleed  . Lung cancer Father     smoker  . Breast cancer Sister   . Dementia Paternal Grandmother   . Stroke Neg Hx   . Diabetes Neg Hx     Review of Systems  Constitutional: Negative for chills and fever.  HENT: Negative for hearing loss and tinnitus.   Eyes: Negative for visual disturbance.  Respiratory: Positive for cough (allergy related). Negative for shortness of breath and wheezing.   Cardiovascular: Negative for chest pain, palpitations and leg swelling.  Gastrointestinal: Negative for abdominal pain, blood in stool, constipation, diarrhea and nausea.       No gerd  Genitourinary: Negative for dysuria and hematuria.  Musculoskeletal: Positive for arthralgias (mild arthritis in hands).  Skin: Negative for color change and rash.  Neurological: Positive for dizziness (occasional). Negative for light-headedness and headaches.  Psychiatric/Behavioral: Negative for dysphoric mood. The patient is not nervous/anxious.        Objective:   Vitals:   01/22/16 0902  BP: 132/90  Pulse: (!) 57  Resp: 16  Temp: 98.1 F (36.7 C)   Filed Weights   01/22/16 0902  Weight: 171 lb (77.6 kg)   Body mass index is 23.85 kg/m.   Physical Exam Constitutional: He appears  well-developed and well-nourished. No distress.  HENT:  Head: Normocephalic and atraumatic.  Right Ear: External ear normal.  Left Ear: External ear normal.  Mouth/Throat: Oropharynx is clear and moist.  Normal ear canals and TM b/l  Eyes: Conjunctivae and EOM are normal.  Neck: Neck supple. No tracheal deviation present. No thyromegaly present.  No carotid bruit  Cardiovascular: Normal rate, regular rhythm, normal heart sounds and intact distal pulses.   No murmur heard. Pulmonary/Chest: Effort normal and breath sounds normal. No respiratory distress. He has no wheezes. He has no  rales.  Abdominal: Soft. Bowel sounds are normal. He exhibits no distension. There is no tenderness.  Genitourinary: deferred  Musculoskeletal: He exhibits no edema.  Lymphadenopathy:    He has no cervical adenopathy.  Skin: Skin is warm and dry. He is not diaphoretic.  Psychiatric: He has a normal mood and affect. His behavior is normal.       Assessment & Plan:   Wellness Exam: Immunizations  Flu vaccine today, other vaccines up to date Colonoscopy   Up to date  Eye exam  Up to date  Hearing loss  none Memory concerns/difficulties  none Independent of ADLs  - fully Stressed the importance of regular exercise   Patient received copy of preventative screening tests/immunizations recommended for the next 5-10 years.  Physical exam: Screening blood work  ordered Immunizations  Flu vaccine today, other up to date Colonoscopy  Up to date  Eye exams  Up to date  Exercise  regular Weight  -  Normal BMI Skin  - no concerns, normal exam Substance abuse --  none   See Problem List for Assessment and Plan of chronic medical problems.   Follow up annually

## 2016-01-22 ENCOUNTER — Ambulatory Visit (INDEPENDENT_AMBULATORY_CARE_PROVIDER_SITE_OTHER): Payer: Medicare HMO | Admitting: Internal Medicine

## 2016-01-22 ENCOUNTER — Other Ambulatory Visit (INDEPENDENT_AMBULATORY_CARE_PROVIDER_SITE_OTHER): Payer: Medicare HMO

## 2016-01-22 ENCOUNTER — Encounter: Payer: Self-pay | Admitting: Internal Medicine

## 2016-01-22 VITALS — BP 132/90 | HR 57 | Temp 98.1°F | Resp 16 | Ht 71.0 in | Wt 171.0 lb

## 2016-01-22 DIAGNOSIS — Z0000 Encounter for general adult medical examination without abnormal findings: Secondary | ICD-10-CM

## 2016-01-22 DIAGNOSIS — E785 Hyperlipidemia, unspecified: Secondary | ICD-10-CM

## 2016-01-22 DIAGNOSIS — I10 Essential (primary) hypertension: Secondary | ICD-10-CM | POA: Diagnosis not present

## 2016-01-22 DIAGNOSIS — Z23 Encounter for immunization: Secondary | ICD-10-CM | POA: Diagnosis not present

## 2016-01-22 DIAGNOSIS — Z8546 Personal history of malignant neoplasm of prostate: Secondary | ICD-10-CM

## 2016-01-22 DIAGNOSIS — Z125 Encounter for screening for malignant neoplasm of prostate: Secondary | ICD-10-CM

## 2016-01-22 LAB — CBC WITH DIFFERENTIAL/PLATELET
Basophils Absolute: 0 10*3/uL (ref 0.0–0.1)
Basophils Relative: 0.6 % (ref 0.0–3.0)
EOS PCT: 2.6 % (ref 0.0–5.0)
Eosinophils Absolute: 0.2 10*3/uL (ref 0.0–0.7)
HCT: 43.6 % (ref 39.0–52.0)
Hemoglobin: 15 g/dL (ref 13.0–17.0)
LYMPHS ABS: 2 10*3/uL (ref 0.7–4.0)
Lymphocytes Relative: 30.4 % (ref 12.0–46.0)
MCHC: 34.4 g/dL (ref 30.0–36.0)
MCV: 91.9 fl (ref 78.0–100.0)
MONOS PCT: 6.5 % (ref 3.0–12.0)
Monocytes Absolute: 0.4 10*3/uL (ref 0.1–1.0)
NEUTROS ABS: 3.9 10*3/uL (ref 1.4–7.7)
NEUTROS PCT: 59.9 % (ref 43.0–77.0)
PLATELETS: 225 10*3/uL (ref 150.0–400.0)
RBC: 4.75 Mil/uL (ref 4.22–5.81)
RDW: 12.8 % (ref 11.5–15.5)
WBC: 6.5 10*3/uL (ref 4.0–10.5)

## 2016-01-22 LAB — LIPID PANEL
CHOL/HDL RATIO: 5
Cholesterol: 211 mg/dL — ABNORMAL HIGH (ref 0–200)
HDL: 46.5 mg/dL (ref >39.00)
LDL Cholesterol: 146 mg/dL — ABNORMAL HIGH (ref 0–99)
NONHDL: 164.29
Triglycerides: 92 mg/dL (ref 0.0–149.0)
VLDL: 18.4 mg/dL (ref 0.0–40.0)

## 2016-01-22 LAB — COMPREHENSIVE METABOLIC PANEL
ALT: 18 U/L (ref 0–53)
AST: 20 U/L (ref 0–37)
Albumin: 4.1 g/dL (ref 3.5–5.2)
Alkaline Phosphatase: 61 U/L (ref 39–117)
BILIRUBIN TOTAL: 0.3 mg/dL (ref 0.2–1.2)
BUN: 27 mg/dL — ABNORMAL HIGH (ref 6–23)
CO2: 29 meq/L (ref 19–32)
Calcium: 9.4 mg/dL (ref 8.4–10.5)
Chloride: 106 mEq/L (ref 96–112)
Creatinine, Ser: 1.21 mg/dL (ref 0.40–1.50)
GFR: 62.07 mL/min (ref >60.00)
GLUCOSE: 98 mg/dL (ref 70–99)
POTASSIUM: 5.4 meq/L — AB (ref 3.5–5.1)
SODIUM: 141 meq/L (ref 135–145)
Total Protein: 6.7 g/dL (ref 6.0–8.3)

## 2016-01-22 LAB — PSA, MEDICARE: PSA: 0 ng/ml — ABNORMAL LOW (ref 0.10–4.00)

## 2016-01-22 LAB — TSH: TSH: 3.85 u[IU]/mL (ref 0.35–4.50)

## 2016-01-22 NOTE — Progress Notes (Signed)
Pre visit review using our clinic review tool, if applicable. No additional management support is needed unless otherwise documented below in the visit note. 

## 2016-01-22 NOTE — Assessment & Plan Note (Addendum)
No longer follows with urology Check psa

## 2016-01-22 NOTE — Assessment & Plan Note (Signed)
Was on crestor for a while - tolerated it well ? Need it again Check lipids

## 2016-01-22 NOTE — Assessment & Plan Note (Signed)
BP well controlled Current regimen effective and well tolerated Continue current medications at current doses cmp  

## 2016-01-25 ENCOUNTER — Encounter: Payer: Self-pay | Admitting: Internal Medicine

## 2016-01-25 DIAGNOSIS — E875 Hyperkalemia: Secondary | ICD-10-CM

## 2016-01-25 DIAGNOSIS — E785 Hyperlipidemia, unspecified: Secondary | ICD-10-CM

## 2016-01-29 NOTE — Telephone Encounter (Signed)
Please send in Statin to [facility].

## 2016-01-31 MED ORDER — AMLODIPINE BESYLATE 5 MG PO TABS: 5.0000 mg | ORAL_TABLET | Freq: Every day | ORAL | 3 refills | Status: DC

## 2016-01-31 MED ORDER — ATORVASTATIN CALCIUM 20 MG PO TABS: 20.0000 mg | ORAL_TABLET | Freq: Every day | ORAL | 3 refills | Status: DC

## 2016-01-31 MED ORDER — LOSARTAN POTASSIUM 25 MG PO TABS: 25.0000 mg | ORAL_TABLET | Freq: Every day | ORAL | 5 refills | Status: DC

## 2016-03-11 ENCOUNTER — Encounter: Payer: Self-pay | Admitting: Internal Medicine

## 2016-03-18 ENCOUNTER — Other Ambulatory Visit (INDEPENDENT_AMBULATORY_CARE_PROVIDER_SITE_OTHER): Payer: Medicare HMO

## 2016-03-18 ENCOUNTER — Encounter: Payer: Self-pay | Admitting: Internal Medicine

## 2016-03-18 DIAGNOSIS — E875 Hyperkalemia: Secondary | ICD-10-CM

## 2016-03-18 DIAGNOSIS — E785 Hyperlipidemia, unspecified: Secondary | ICD-10-CM

## 2016-03-18 DIAGNOSIS — R69 Illness, unspecified: Secondary | ICD-10-CM | POA: Diagnosis not present

## 2016-03-18 LAB — COMPREHENSIVE METABOLIC PANEL
ALK PHOS: 54 U/L (ref 39–117)
ALT: 21 U/L (ref 0–53)
AST: 21 U/L (ref 0–37)
Albumin: 3.8 g/dL (ref 3.5–5.2)
BILIRUBIN TOTAL: 0.5 mg/dL (ref 0.2–1.2)
BUN: 18 mg/dL (ref 6–23)
CO2: 31 mEq/L (ref 19–32)
CREATININE: 1.2 mg/dL (ref 0.40–1.50)
Calcium: 9.5 mg/dL (ref 8.4–10.5)
Chloride: 107 mEq/L (ref 96–112)
GFR: 62.64 mL/min (ref >60.00)
GLUCOSE: 95 mg/dL (ref 70–99)
Potassium: 4.6 mEq/L (ref 3.5–5.1)
Sodium: 143 mEq/L (ref 135–145)
TOTAL PROTEIN: 6.5 g/dL (ref 6.0–8.3)

## 2016-03-18 LAB — LIPID PANEL
Cholesterol: 147 mg/dL (ref 0–200)
HDL: 52.4 mg/dL (ref >39.00)
LDL Cholesterol: 84 mg/dL (ref 0–99)
NONHDL: 94.42
Total CHOL/HDL Ratio: 3
Triglycerides: 52 mg/dL (ref 0.0–149.0)
VLDL: 10.4 mg/dL (ref 0.0–40.0)

## 2016-04-01 DIAGNOSIS — H1851 Endothelial corneal dystrophy: Secondary | ICD-10-CM | POA: Diagnosis not present

## 2016-04-01 DIAGNOSIS — Z961 Presence of intraocular lens: Secondary | ICD-10-CM | POA: Diagnosis not present

## 2016-04-01 DIAGNOSIS — H26491 Other secondary cataract, right eye: Secondary | ICD-10-CM | POA: Diagnosis not present

## 2016-04-01 DIAGNOSIS — H47021 Hemorrhage in optic nerve sheath, right eye: Secondary | ICD-10-CM | POA: Diagnosis not present

## 2016-04-15 ENCOUNTER — Encounter (INDEPENDENT_AMBULATORY_CARE_PROVIDER_SITE_OTHER): Payer: Medicare HMO | Admitting: Ophthalmology

## 2016-04-15 DIAGNOSIS — H26491 Other secondary cataract, right eye: Secondary | ICD-10-CM | POA: Diagnosis not present

## 2016-04-15 DIAGNOSIS — H43813 Vitreous degeneration, bilateral: Secondary | ICD-10-CM | POA: Diagnosis not present

## 2016-04-15 DIAGNOSIS — H1851 Endothelial corneal dystrophy: Secondary | ICD-10-CM

## 2016-04-15 DIAGNOSIS — H35033 Hypertensive retinopathy, bilateral: Secondary | ICD-10-CM | POA: Diagnosis not present

## 2016-04-15 DIAGNOSIS — H3561 Retinal hemorrhage, right eye: Secondary | ICD-10-CM

## 2016-04-15 DIAGNOSIS — I10 Essential (primary) hypertension: Secondary | ICD-10-CM | POA: Diagnosis not present

## 2016-04-26 ENCOUNTER — Ambulatory Visit (INDEPENDENT_AMBULATORY_CARE_PROVIDER_SITE_OTHER): Payer: Medicare HMO | Admitting: Ophthalmology

## 2016-04-26 DIAGNOSIS — H3561 Retinal hemorrhage, right eye: Secondary | ICD-10-CM

## 2016-04-26 DIAGNOSIS — H2701 Aphakia, right eye: Secondary | ICD-10-CM | POA: Diagnosis not present

## 2016-05-20 ENCOUNTER — Encounter: Payer: Self-pay | Admitting: Internal Medicine

## 2016-05-22 MED ORDER — ATORVASTATIN CALCIUM 20 MG PO TABS: 20.0000 mg | ORAL_TABLET | Freq: Every day | ORAL | 1 refills | Status: DC

## 2016-05-22 MED ORDER — AMLODIPINE BESYLATE 5 MG PO TABS: 5.0000 mg | ORAL_TABLET | Freq: Every day | ORAL | 1 refills | Status: DC

## 2016-05-22 MED ORDER — LOSARTAN POTASSIUM 25 MG PO TABS: 25.0000 mg | ORAL_TABLET | Freq: Every day | ORAL | 1 refills | Status: DC

## 2016-07-01 ENCOUNTER — Encounter (INDEPENDENT_AMBULATORY_CARE_PROVIDER_SITE_OTHER): Payer: Medicare HMO | Admitting: Ophthalmology

## 2016-07-01 DIAGNOSIS — H3561 Retinal hemorrhage, right eye: Secondary | ICD-10-CM | POA: Diagnosis not present

## 2016-07-01 DIAGNOSIS — H35033 Hypertensive retinopathy, bilateral: Secondary | ICD-10-CM | POA: Diagnosis not present

## 2016-07-01 DIAGNOSIS — H43813 Vitreous degeneration, bilateral: Secondary | ICD-10-CM | POA: Diagnosis not present

## 2016-07-01 DIAGNOSIS — I10 Essential (primary) hypertension: Secondary | ICD-10-CM | POA: Diagnosis not present

## 2016-07-01 DIAGNOSIS — H1851 Endothelial corneal dystrophy: Secondary | ICD-10-CM | POA: Diagnosis not present

## 2016-09-17 DIAGNOSIS — E785 Hyperlipidemia, unspecified: Secondary | ICD-10-CM | POA: Diagnosis not present

## 2016-09-17 DIAGNOSIS — Z7982 Long term (current) use of aspirin: Secondary | ICD-10-CM | POA: Diagnosis not present

## 2016-09-17 DIAGNOSIS — Z0000 Encounter for general adult medical examination without abnormal findings: Secondary | ICD-10-CM | POA: Diagnosis not present

## 2016-09-17 DIAGNOSIS — Z79899 Other long term (current) drug therapy: Secondary | ICD-10-CM | POA: Diagnosis not present

## 2016-09-17 DIAGNOSIS — I10 Essential (primary) hypertension: Secondary | ICD-10-CM | POA: Diagnosis not present

## 2016-09-17 DIAGNOSIS — R233 Spontaneous ecchymoses: Secondary | ICD-10-CM | POA: Diagnosis not present

## 2016-09-17 DIAGNOSIS — D509 Iron deficiency anemia, unspecified: Secondary | ICD-10-CM | POA: Diagnosis not present

## 2016-09-17 DIAGNOSIS — Z6823 Body mass index (BMI) 23.0-23.9, adult: Secondary | ICD-10-CM | POA: Diagnosis not present

## 2016-10-02 DIAGNOSIS — H1851 Endothelial corneal dystrophy: Secondary | ICD-10-CM | POA: Diagnosis not present

## 2016-10-02 DIAGNOSIS — Z961 Presence of intraocular lens: Secondary | ICD-10-CM | POA: Diagnosis not present

## 2016-11-28 ENCOUNTER — Other Ambulatory Visit: Payer: Self-pay | Admitting: Emergency Medicine

## 2016-11-28 ENCOUNTER — Encounter: Payer: Self-pay | Admitting: Internal Medicine

## 2016-11-28 MED ORDER — ATORVASTATIN CALCIUM 20 MG PO TABS: 20.0000 mg | ORAL_TABLET | Freq: Every day | ORAL | 0 refills | Status: DC

## 2016-11-28 MED ORDER — LOSARTAN POTASSIUM 25 MG PO TABS: 25.0000 mg | ORAL_TABLET | Freq: Every day | ORAL | 0 refills | Status: DC

## 2016-12-04 ENCOUNTER — Encounter: Payer: Self-pay | Admitting: Internal Medicine

## 2016-12-04 MED ORDER — AMLODIPINE BESYLATE 5 MG PO TABS: 5.0000 mg | ORAL_TABLET | Freq: Every day | ORAL | 0 refills | Status: DC

## 2017-01-21 NOTE — Progress Notes (Signed)
Subjective:    Patient ID: Ronald Ross, male    DOB: 17-Nov-1940, 76 y.o.   MRN: [id number]  HPI Here for medicare wellness exam and an annual physical exam.   I have personally reviewed and have noted 1.The patient's medical and social history 2.Their use of alcohol, tobacco or illicit drugs 3.Their current medications and supplements 4.The patient's functional ability including ADL's, fall risks, home                 safety risk and hearing or visual impairment. 5.Diet and physical activities 6.Evidence for depression or mood disorders 7.Care team reviewed  -  Eye doctor - Dr Larence Penning, dentist    Are there smokers in your home (other than you)? No  Risk Factors Exercise: walking regularly Dietary issues discussed: well balanced, lots of fruit and veges, trying to dec carbs  Cardiac risk factors: advanced age, hypertension, hyperlipidemia  Depression Screen  Have you felt down, depressed or hopeless? No  Have you felt little interest or pleasure in doing things?  No  Activities of Daily Living In your present state of health, do you have any difficulty performing the following activities?:  Driving? No Managing money?  No Feeding yourself? No Getting from bed to chair? No Climbing a flight of stairs? No Preparing food and eating?: No Bathing or showering? No Getting dressed: No Getting to/using the toilet? No Moving around from place to place: No In the past year have you fallen or had a near fall?: No   Are you sexually active?  No  Do you have more than one partner?  N/A  Hearing Difficulties: No Do you often ask people to speak up or repeat themselves? No Do you experience ringing or noises in your ears? No Do you have difficulty understanding soft or whispered voices? No Vision:              Any change in vision:  No              Up to date with eye exam:  yes  Memory:  Do you feel that  you have a problem with memory? No  Do you often misplace items? No  Do you feel safe at home?  Yes  Cognitive Testing  Alert, Orientated? Yes  Normal Appearance? Yes  Recall of three objects?  Yes  Can perform simple calculations? Yes  Displays appropriate judgment? Yes  Can read the correct time from a watch face? Yes   Advanced Directives have been discussed with the patient? Yes, in place   Medications and allergies reviewed with patient and updated if appropriate.  Patient Active Problem List   Diagnosis Date Noted  . Polyp of colon, hyperplastic 01/17/2014  . ANEMIA, MILD 04/08/2008  . METHICILLIN RESISTANT STAPHYLOCOCCUS AUREUS INFECTION 06/24/2006  . LIPOMAS, MULTIPLE 06/24/2006  . Hyperlipidemia 06/24/2006  . Essential hypertension 06/24/2006  . PROSTATE CANCER, HX OF 06/24/2006    Current Outpatient Prescriptions on File Prior to Visit  Medication Sig Dispense Refill  . amLODipine (NORVASC) 5 MG tablet Take 1 tablet (5 mg total) by mouth daily. 90 tablet 0  . aspirin 81 MG tablet Take 81 mg by mouth daily.      Marland Kitchen atorvastatin (LIPITOR) 20 MG tablet Take 1 tablet (20 mg total) by mouth daily. 90 tablet 0  . Ferrous Sulfate (IRON) 325 (65 FE) MG TABS Take by mouth daily.    Marland Kitchen loratadine (CLARITIN) 10 MG tablet Take 10 mg  by mouth daily.      Marland Kitchen losartan (COZAAR) 25 MG tablet Take 1 tablet (25 mg total) by mouth daily. 90 tablet 0  . Multiple Vitamin (MULTIVITAMIN) tablet Take 1 tablet by mouth daily.      . Omega-3 Fatty Acids (FISH OIL) 1000 MG CAPS Take by mouth 2 (two) times daily.       No current facility-administered medications on file prior to visit.     Past Medical History:  Diagnosis Date  . Anemia    blood donor  . Fuchs' corneal dystrophy    Dr. Patrice Paradise; [facility] , [place]  . Hyperlipidemia   . Hypertension   . Personal history of prostate cancer    Dr. Serita Butcher    Past Surgical History:  Procedure Laterality Date  . COLONOSCOPY  2009    Dr Fuller Plan, virtual colonoscopy recommended  in 2013 because of tortuous colon  . colonoscopy with polypectomy  2003  . [place] 2010   cataract & implant bilaterally  . PROSTATE SURGERY  1998   prostatectomy    Social History   Social History  . Marital status: Married    Spouse name: N/A  . Number of children: 1  . Years of education: N/A   Occupational History  . Retired    Social History Main Topics  . Smoking status: Never Smoker  . Smokeless tobacco: Never Used  . Alcohol use 0.0 oz/week     Comment:  seldom  . Drug use: No  . Sexual activity: Not Asked   Other Topics Concern  . None   Social History Narrative  . None    Family History  Problem Relation Age of Onset  . Hypertension Mother   . Heart attack Mother        in 7s  . Atrial fibrillation Mother        Aortic Stenosis  . Anemia Mother        ? slow GI bleed  . Lung cancer Father        smoker  . Breast cancer Sister   . Dementia Paternal Grandmother   . Stroke Neg Hx   . Diabetes Neg Hx     Review of Systems  Constitutional: Negative for chills and fever.  HENT: Negative for hearing loss and tinnitus.   Eyes: Negative for visual disturbance.  Respiratory: Negative for cough, shortness of breath and wheezing.   Cardiovascular: Negative for chest pain, palpitations and leg swelling.  Gastrointestinal: Negative for abdominal pain, blood in stool, constipation, diarrhea and nausea.       Rare gerd  Genitourinary: Negative for dysuria and hematuria.       Mild incontinence  Musculoskeletal: Positive for arthralgias and back pain (mild, chronic).  Skin: Negative for color change and rash.  Neurological: Negative for light-headedness and headaches.  Psychiatric/Behavioral: Positive for sleep disturbance (gets about 4 hrs). Negative for dysphoric mood. The patient is not nervous/anxious.        Objective:   Vitals:   01/22/17 0830  BP: 124/86  Pulse: 76  Resp: 16  Temp: 98.4  F (36.9 C)  SpO2: 97%   Filed Weights   01/22/17 0830  Weight: 169 lb (76.7 kg)   Body mass index is 23.57 kg/m.  Wt Readings from Last 3 Encounters:  01/22/17 169 lb (76.7 kg)  01/22/16 171 lb (77.6 kg)  05/24/15 173 lb 6.4 oz (78.7 kg)     Physical Exam Constitutional:  He appears well-developed and well-nourished. No distress.  HENT:  Head: Normocephalic and atraumatic.  Right Ear: External ear normal.  Left Ear: External ear normal.  Mouth/Throat: Oropharynx is clear and moist.  Normal ear canals and TM b/l  Eyes: Conjunctivae and EOM are normal.  Neck: Neck supple. No tracheal deviation present. No thyromegaly present.  No carotid bruit  Cardiovascular: Normal rate, regular rhythm, normal heart sounds and intact distal pulses.  No murmur heard. Pulmonary/Chest: Effort normal and breath sounds normal. No respiratory distress. He has no wheezes. He has no rales.  Abdominal: Soft. He exhibits no distension. There is no tenderness.  Genitourinary: deferred  Musculoskeletal: He exhibits no edema.  Lymphadenopathy:   He has no cervical adenopathy.  Skin: Skin is warm and dry. He is not diaphoretic.  Psychiatric: He has a normal mood and affect. His behavior is normal.         Assessment & Plan:   Wellness Exam: Immunizations   Flu and shingrix recommended Colonoscopy  No longer needed due to age Eye exam  Up to date  Hearing loss  none Memory concerns/difficulties   none Independent of ADLs  Fully independent Stressed the importance of regular exercise, which he does   Patient received copy of preventative screening tests/immunizations recommended for the next 5-10 years.  Physical exam: Screening blood work  ordered Immunizations   Flu and shingrix recommended Colonoscopy  No longer needed due to age Eye exams    Up to date  EKG  Last EKG 2014 Exercise  Walking regularly Weight  - normal BMI -discussed increasing weight training to help maintain muscle  mass Skin   No concerns Substance abuse  none  See Problem List for Assessment and Plan of chronic medical problems.

## 2017-01-21 NOTE — Patient Instructions (Addendum)
[name] , Thank you for taking time to come for your Medicare Wellness Visit. I appreciate your ongoing commitment to your health goals. Please review the following plan we discussed and let me know if I can assist you in the future.   These are the goals we discussed: Goals    . Get the flu and shingles vaccine            This is a list of the screening recommended for you and due dates:  Health Maintenance  Topic Date Due  . Flu Shot  12/04/2016  . Tetanus Vaccine  10/20/2019  . Pneumonia vaccines  Completed   Test(s) ordered today. Your results will be released to [place] (or called to you) after review, usually within 72hours after test completion. If any changes need to be made, you will be notified at that same time.  All other Health Maintenance issues reviewed.   All recommended immunizations and age-appropriate screenings are up-to-date or discussed.  No immunizations administered today.   Medications reviewed and updated.  No changes recommended at this time.  Your prescription(s) have been submitted to your pharmacy. Please take as directed and contact our office if you believe you are having problem(s) with the medication(s).   Please followup in one year    Health Maintenance, Male A healthy lifestyle and preventive care is important for your health and wellness. Ask your health care provider about what schedule of regular examinations is right for you. What should I know about weight and diet? Eat a Healthy Diet  Eat plenty of vegetables, fruits, whole grains, low-fat dairy products, and lean protein.  Do not eat a lot of foods high in solid fats, added sugars, or salt.  Maintain a Healthy Weight Regular exercise can help you achieve or maintain a healthy weight. You should:  Do at least 150 minutes of exercise each week. The exercise should increase your heart rate and make you sweat (moderate-intensity exercise).  Do strength-training exercises at  least twice a week.  Watch Your Levels of Cholesterol and Blood Lipids  Have your blood tested for lipids and cholesterol every 5 years starting at 76 years of age. If you are at high risk for heart disease, you should start having your blood tested when you are 76 years old. You may need to have your cholesterol levels checked more often if: ? Your lipid or cholesterol levels are high. ? You are older than 76 years of age. ? You are at high risk for heart disease.  What should I know about cancer screening? Many types of cancers can be detected early and may often be prevented. Lung Cancer  You should be screened every year for lung cancer if: ? You are a current smoker who has smoked for at least 30 years. ? You are a former smoker who has quit within the past 15 years.  Talk to your health care provider about your screening options, when you should start screening, and how often you should be screened.  Colorectal Cancer  Routine colorectal cancer screening usually begins at 76 years of age and should be repeated every 5-10 years until you are 76 years old. You may need to be screened more often if early forms of precancerous polyps or small growths are found. Your health care provider may recommend screening at an earlier age if you have risk factors for colon cancer.  Your health care provider may recommend using home test kits to check for  hidden blood in the stool.  A small camera at the end of a tube can be used to examine your colon (sigmoidoscopy or colonoscopy). This checks for the earliest forms of colorectal cancer.  Prostate and Testicular Cancer  Depending on your age and overall health, your health care provider may do certain tests to screen for prostate and testicular cancer.  Talk to your health care provider about any symptoms or concerns you have about testicular or prostate cancer.  Skin Cancer  Check your skin from head to toe regularly.  Tell your health  care provider about any new moles or changes in moles, especially if: ? There is a change in a mole's size, shape, or color. ? You have a mole that is larger than a pencil eraser.  Always use sunscreen. Apply sunscreen liberally and repeat throughout the day.  Protect yourself by wearing long sleeves, pants, a wide-brimmed hat, and sunglasses when outside.  What should I know about heart disease, diabetes, and high blood pressure?  If you are 47-27 years of age, have your blood pressure checked every 3-5 years. If you are 72 years of age or older, have your blood pressure checked every year. You should have your blood pressure measured twice-once when you are at a hospital or clinic, and once when you are not at a hospital or clinic. Record the average of the two measurements. To check your blood pressure when you are not at a hospital or clinic, you can use: ? An automated blood pressure machine at a pharmacy. ? A home blood pressure monitor.  Talk to your health care provider about your target blood pressure.  If you are between 15-4 years old, ask your health care provider if you should take aspirin to prevent heart disease.  Have regular diabetes screenings by checking your fasting blood sugar level. ? If you are at a normal weight and have a low risk for diabetes, have this test once every three years after the age of 43. ? If you are overweight and have a high risk for diabetes, consider being tested at a younger age or more often.  A one-time screening for abdominal aortic aneurysm (AAA) by ultrasound is recommended for men aged 27-75 years who are current or former smokers. What should I know about preventing infection? Hepatitis B If you have a higher risk for hepatitis B, you should be screened for this virus. Talk with your health care provider to find out if you are at risk for hepatitis B infection. Hepatitis C Blood testing is recommended for:  Everyone born from [birth year]  through 1965.  Anyone with known risk factors for hepatitis C.  Sexually Transmitted Diseases (STDs)  You should be screened each year for STDs including gonorrhea and chlamydia if: ? You are sexually active and are younger than 76 years of age. ? You are older than 76 years of age and your health care provider tells you that you are at risk for this type of infection. ? Your sexual activity has changed since you were last screened and you are at an increased risk for chlamydia or gonorrhea. Ask your health care provider if you are at risk.  Talk with your health care provider about whether you are at high risk of being infected with HIV. Your health care provider may recommend a prescription medicine to help prevent HIV infection.  What else can I do?  Schedule regular health, dental, and eye exams.  Stay current with your  vaccines (immunizations).  Do not use any tobacco products, such as cigarettes, chewing tobacco, and e-cigarettes. If you need help quitting, ask your health care provider.  Limit alcohol intake to no more than 2 drinks per day. One drink equals 12 ounces of beer, 5 ounces of wine, or 1 ounces of hard liquor.  Do not use street drugs.  Do not share needles.  Ask your health care provider for help if you need support or information about quitting drugs.  Tell your health care provider if you often feel depressed.  Tell your health care provider if you have ever been abused or do not feel safe at home. This information is not intended to replace advice given to you by your health care provider. Make sure you discuss any questions you have with your health care provider. Document Released: 10/19/2007 Document Revised: 12/20/2015 Document Reviewed: 01/24/2015 Elsevier Interactive Patient Education  [employer].

## 2017-01-22 ENCOUNTER — Ambulatory Visit (INDEPENDENT_AMBULATORY_CARE_PROVIDER_SITE_OTHER): Payer: Medicare HMO | Admitting: Internal Medicine

## 2017-01-22 ENCOUNTER — Encounter: Payer: Self-pay | Admitting: Internal Medicine

## 2017-01-22 ENCOUNTER — Other Ambulatory Visit (INDEPENDENT_AMBULATORY_CARE_PROVIDER_SITE_OTHER): Payer: Medicare HMO

## 2017-01-22 VITALS — BP 124/86 | HR 76 | Temp 98.4°F | Resp 16 | Ht 71.0 in | Wt 169.0 lb

## 2017-01-22 DIAGNOSIS — I10 Essential (primary) hypertension: Secondary | ICD-10-CM | POA: Diagnosis not present

## 2017-01-22 DIAGNOSIS — Z8546 Personal history of malignant neoplasm of prostate: Secondary | ICD-10-CM

## 2017-01-22 DIAGNOSIS — Z0000 Encounter for general adult medical examination without abnormal findings: Secondary | ICD-10-CM | POA: Diagnosis not present

## 2017-01-22 DIAGNOSIS — E785 Hyperlipidemia, unspecified: Secondary | ICD-10-CM

## 2017-01-22 LAB — CBC WITH DIFFERENTIAL/PLATELET
BASOS PCT: 0.9 % (ref 0.0–3.0)
Basophils Absolute: 0.1 10*3/uL (ref 0.0–0.1)
EOS PCT: 3.1 % (ref 0.0–5.0)
Eosinophils Absolute: 0.2 10*3/uL (ref 0.0–0.7)
HCT: 43.2 % (ref 39.0–52.0)
HEMOGLOBIN: 14.6 g/dL (ref 13.0–17.0)
LYMPHS ABS: 1.9 10*3/uL (ref 0.7–4.0)
Lymphocytes Relative: 32.1 % (ref 12.0–46.0)
MCHC: 33.9 g/dL (ref 30.0–36.0)
MCV: 92.1 fl (ref 78.0–100.0)
MONO ABS: 0.5 10*3/uL (ref 0.1–1.0)
MONOS PCT: 7.9 % (ref 3.0–12.0)
NEUTROS PCT: 56 % (ref 43.0–77.0)
Neutro Abs: 3.3 10*3/uL (ref 1.4–7.7)
Platelets: 204 10*3/uL (ref 150.0–400.0)
RBC: 4.7 Mil/uL (ref 4.22–5.81)
RDW: 13.2 % (ref 11.5–15.5)
WBC: 5.9 10*3/uL (ref 4.0–10.5)

## 2017-01-22 LAB — LIPID PANEL
CHOLESTEROL: 151 mg/dL (ref 0–200)
HDL: 61.5 mg/dL (ref >39.00)
LDL Cholesterol: 78 mg/dL (ref 0–99)
NonHDL: 89.7
Total CHOL/HDL Ratio: 2
Triglycerides: 60 mg/dL (ref 0.0–149.0)
VLDL: 12 mg/dL (ref 0.0–40.0)

## 2017-01-22 LAB — COMPREHENSIVE METABOLIC PANEL
ALBUMIN: 4.1 g/dL (ref 3.5–5.2)
ALT: 20 U/L (ref 0–53)
AST: 21 U/L (ref 0–37)
Alkaline Phosphatase: 58 U/L (ref 39–117)
BUN: 15 mg/dL (ref 6–23)
CO2: 29 mEq/L (ref 19–32)
Calcium: 10 mg/dL (ref 8.4–10.5)
Chloride: 105 mEq/L (ref 96–112)
Creatinine, Ser: 1.29 mg/dL (ref 0.40–1.50)
GFR: 57.49 mL/min — AB (ref >60.00)
GLUCOSE: 100 mg/dL — AB (ref 70–99)
POTASSIUM: 4 meq/L (ref 3.5–5.1)
SODIUM: 142 meq/L (ref 135–145)
TOTAL PROTEIN: 6.8 g/dL (ref 6.0–8.3)
Total Bilirubin: 0.7 mg/dL (ref 0.2–1.2)

## 2017-01-22 LAB — TSH: TSH: 3.27 u[IU]/mL (ref 0.35–4.50)

## 2017-01-22 LAB — PSA, MEDICARE: PSA: 0 ng/ml — ABNORMAL LOW (ref 0.10–4.00)

## 2017-01-22 MED ORDER — AMLODIPINE BESYLATE 5 MG PO TABS: 5.0000 mg | ORAL_TABLET | Freq: Every day | ORAL | 3 refills | Status: DC

## 2017-01-22 MED ORDER — ATORVASTATIN CALCIUM 20 MG PO TABS: 20.0000 mg | ORAL_TABLET | Freq: Every day | ORAL | 3 refills | Status: DC

## 2017-01-22 NOTE — Assessment & Plan Note (Signed)
Check lipid panel  Continue daily statin Regular exercise and healthy diet encouraged  

## 2017-01-22 NOTE — Assessment & Plan Note (Signed)
bp at home 106/67 - 157/86 BP well controlled Current regimen effective and well tolerated Continue current medications at current doses cmp

## 2017-01-22 NOTE — Assessment & Plan Note (Signed)
No longer following with urology Will check psa

## 2017-01-23 ENCOUNTER — Encounter: Payer: Self-pay | Admitting: Internal Medicine

## 2017-02-04 DIAGNOSIS — R69 Illness, unspecified: Secondary | ICD-10-CM | POA: Diagnosis not present

## 2017-02-17 DIAGNOSIS — R69 Illness, unspecified: Secondary | ICD-10-CM | POA: Diagnosis not present

## 2017-03-03 ENCOUNTER — Other Ambulatory Visit: Payer: Self-pay | Admitting: Internal Medicine

## 2017-03-03 MED ORDER — LOSARTAN POTASSIUM 25 MG PO TABS: 25.0000 mg | ORAL_TABLET | Freq: Every day | ORAL | 2 refills | Status: DC

## 2017-04-02 DIAGNOSIS — Z961 Presence of intraocular lens: Secondary | ICD-10-CM | POA: Diagnosis not present

## 2017-04-02 DIAGNOSIS — H1851 Endothelial corneal dystrophy: Secondary | ICD-10-CM | POA: Diagnosis not present

## 2017-05-12 DIAGNOSIS — Z7982 Long term (current) use of aspirin: Secondary | ICD-10-CM | POA: Diagnosis not present

## 2017-05-12 DIAGNOSIS — E785 Hyperlipidemia, unspecified: Secondary | ICD-10-CM | POA: Diagnosis not present

## 2017-05-12 DIAGNOSIS — I10 Essential (primary) hypertension: Secondary | ICD-10-CM | POA: Diagnosis not present

## 2017-08-13 DIAGNOSIS — R69 Illness, unspecified: Secondary | ICD-10-CM | POA: Diagnosis not present

## 2017-12-10 ENCOUNTER — Other Ambulatory Visit: Payer: Self-pay | Admitting: Internal Medicine

## 2017-12-10 MED ORDER — LOSARTAN POTASSIUM 25 MG PO TABS: 25.0000 mg | ORAL_TABLET | Freq: Every day | ORAL | 0 refills | Status: DC

## 2017-12-10 NOTE — Telephone Encounter (Signed)
Copied from [place] [phone] [id number]. Topic: Quick Communication - Rx Refill/Question >> Dec 10, 2017 11:38 AM Oliver Pila B wrote: Medication: losartan (COZAAR) 25 MG tablet [[id number]]   Has the patient contacted their pharmacy? Yes.   (Agent: If no, request that the patient contact the pharmacy for the refill.) (Agent: If yes, when and what did the pharmacy advise?)  Preferred Pharmacy (with phone number or street name): [facility] @ [place], [place] [phone]  Agent: Please be advised that RX refills may take up to 3 business days. We ask that you follow-up with your pharmacy.

## 2017-12-10 NOTE — Telephone Encounter (Signed)
LOV  01/22/17  NOV  01/23/18  LR 03/03/17  For #90 and 2 refills   [facility]  [address]  [place], [place]  Dr. Quay Burow

## 2017-12-12 ENCOUNTER — Telehealth: Payer: Self-pay | Admitting: Internal Medicine

## 2017-12-12 MED ORDER — AMLODIPINE BESYLATE 5 MG PO TABS: 5.0000 mg | ORAL_TABLET | Freq: Every day | ORAL | 0 refills | Status: DC

## 2017-12-12 MED ORDER — ATORVASTATIN CALCIUM 20 MG PO TABS: 20.0000 mg | ORAL_TABLET | Freq: Every day | ORAL | 0 refills | Status: DC

## 2017-12-12 NOTE — Telephone Encounter (Signed)
Refills sent to [facility] 

## 2017-12-12 NOTE — Telephone Encounter (Signed)
Copied from [place] [phone]. Topic: General - Other >> Dec 12, 2017 11:49 AM Lennox Solders wrote: Reason for CRM: pt is calling and needs refills on amlodipine and atorvastatin #90 each sent to [facility] in [place] [address] phone number [phone]

## 2018-01-22 NOTE — Patient Instructions (Addendum)
[name] , Thank you for taking time to come for your Medicare Wellness Visit. I appreciate your ongoing commitment to your health goals. Please review the following plan we discussed and let me know if I can assist you in the future.   These are the goals we discussed: Goals    . Continue regular walking       This is a list of the screening recommended for you and due dates:  Health Maintenance  Topic Date Due  . Flu Shot  today  . Tetanus Vaccine  10/20/2019  . Pneumonia vaccines  Completed     Tests ordered today. Your results will be released to [place] (or called to you) after review, usually within 72hours after test completion. If any changes need to be made, you will be notified at that same time.  All other Health Maintenance issues reviewed.   All recommended immunizations and age-appropriate screenings are up-to-date or discussed.  Flu immunization administered today.    Medications reviewed and updated.  Changes include :   none   Please followup in one year     Health Maintenance, Male A healthy lifestyle and preventive care is important for your health and wellness. Ask your health care provider about what schedule of regular examinations is right for you. What should I know about weight and diet? Eat a Healthy Diet  Eat plenty of vegetables, fruits, whole grains, low-fat dairy products, and lean protein.  Do not eat a lot of foods high in solid fats, added sugars, or salt.  Maintain a Healthy Weight Regular exercise can help you achieve or maintain a healthy weight. You should:  Do at least 150 minutes of exercise each week. The exercise should increase your heart rate and make you sweat (moderate-intensity exercise).  Do strength-training exercises at least twice a week.  Watch Your Levels of Cholesterol and Blood Lipids  Have your blood tested for lipids and cholesterol every 5 years starting at 77 years of age. If you are at high risk for heart  disease, you should start having your blood tested when you are 77 years old. You may need to have your cholesterol levels checked more often if: ? Your lipid or cholesterol levels are high. ? You are older than 77 years of age. ? You are at high risk for heart disease.  What should I know about cancer screening? Many types of cancers can be detected early and may often be prevented. Lung Cancer  You should be screened every year for lung cancer if: ? You are a current smoker who has smoked for at least 30 years. ? You are a former smoker who has quit within the past 15 years.  Talk to your health care provider about your screening options, when you should start screening, and how often you should be screened.  Colorectal Cancer  Routine colorectal cancer screening usually begins at 77 years of age and should be repeated every 5-10 years until you are 77 years old. You may need to be screened more often if early forms of precancerous polyps or small growths are found. Your health care provider may recommend screening at an earlier age if you have risk factors for colon cancer.  Your health care provider may recommend using home test kits to check for hidden blood in the stool.  A small camera at the end of a tube can be used to examine your colon (sigmoidoscopy or colonoscopy). This checks for the earliest forms  of colorectal cancer.  Prostate and Testicular Cancer  Depending on your age and overall health, your health care provider may do certain tests to screen for prostate and testicular cancer.  Talk to your health care provider about any symptoms or concerns you have about testicular or prostate cancer.  Skin Cancer  Check your skin from head to toe regularly.  Tell your health care provider about any new moles or changes in moles, especially if: ? There is a change in a mole's size, shape, or color. ? You have a mole that is larger than a pencil eraser.  Always use  sunscreen. Apply sunscreen liberally and repeat throughout the day.  Protect yourself by wearing long sleeves, pants, a wide-brimmed hat, and sunglasses when outside.  What should I know about heart disease, diabetes, and high blood pressure?  If you are 22-22 years of age, have your blood pressure checked every 3-5 years. If you are 2 years of age or older, have your blood pressure checked every year. You should have your blood pressure measured twice-once when you are at a hospital or clinic, and once when you are not at a hospital or clinic. Record the average of the two measurements. To check your blood pressure when you are not at a hospital or clinic, you can use: ? An automated blood pressure machine at a pharmacy. ? A home blood pressure monitor.  Talk to your health care provider about your target blood pressure.  If you are between 60-16 years old, ask your health care provider if you should take aspirin to prevent heart disease.  Have regular diabetes screenings by checking your fasting blood sugar level. ? If you are at a normal weight and have a low risk for diabetes, have this test once every three years after the age of 31. ? If you are overweight and have a high risk for diabetes, consider being tested at a younger age or more often.  A one-time screening for abdominal aortic aneurysm (AAA) by ultrasound is recommended for men aged 73-75 years who are current or former smokers. What should I know about preventing infection? Hepatitis B If you have a higher risk for hepatitis B, you should be screened for this virus. Talk with your health care provider to find out if you are at risk for hepatitis B infection. Hepatitis C Blood testing is recommended for:  Everyone born from [birth year] through 1965.  Anyone with known risk factors for hepatitis C.  Sexually Transmitted Diseases (STDs)  You should be screened each year for STDs including gonorrhea and chlamydia if: ? You are  sexually active and are younger than 77 years of age. ? You are older than 77 years of age and your health care provider tells you that you are at risk for this type of infection. ? Your sexual activity has changed since you were last screened and you are at an increased risk for chlamydia or gonorrhea. Ask your health care provider if you are at risk.  Talk with your health care provider about whether you are at high risk of being infected with HIV. Your health care provider may recommend a prescription medicine to help prevent HIV infection.  What else can I do?  Schedule regular health, dental, and eye exams.  Stay current with your vaccines (immunizations).  Do not use any tobacco products, such as cigarettes, chewing tobacco, and e-cigarettes. If you need help quitting, ask your health care provider.  Limit alcohol intake to  no more than 2 drinks per day. One drink equals 12 ounces of beer, 5 ounces of wine, or 1 ounces of hard liquor.  Do not use street drugs.  Do not share needles.  Ask your health care provider for help if you need support or information about quitting drugs.  Tell your health care provider if you often feel depressed.  Tell your health care provider if you have ever been abused or do not feel safe at home. This information is not intended to replace advice given to you by your health care provider. Make sure you discuss any questions you have with your health care provider. Document Released: 10/19/2007 Document Revised: 12/20/2015 Document Reviewed: 01/24/2015 Elsevier Interactive Patient Education  [employer].

## 2018-01-22 NOTE — Progress Notes (Signed)
Subjective:    Patient ID: Ronald Ross, male    DOB: July 15, 1940, 77 y.o.   MRN: [id number]  HPI Here for medicare wellness exam and a physical exam.    He feels well.   He has no concerns.  He denies any changes in his health.  His balance is still not good, but it has not gotten worse. He denies falls.  He is walking regularly - almost daily.   I have personally reviewed and have noted 1.The patient's medical and social history 2.Their use of alcohol, tobacco or illicit drugs 3.Their current medications and supplements 4.The patient's functional ability including ADL's, fall risks, home                 safety risk and hearing or visual impairment. 5.Diet and physical activities 6.Evidence for depression or mood disorders 7.Care team reviewed  -  Eye - Dr Larence Penning, dentist, GI - Dr Fuller Plan    Are there smokers in your home (other than you)? No  Risk Factors Exercise:  Walking almost daily Dietary issues discussed:  No change, well balanced.  Lost os fruits and veges  Vitamin and supplement use:  Mvi, iron, fish oil, magnesium  Opiod use:  No  Side effects from medication:  N/a  Does medications benefits outweigh risks/side effects:  n/a  Cardiac risk factors: advanced age, hypertension, hyperlipidemia  Depression Screen  Have you felt down, depressed or hopeless? No  Have you felt little interest or pleasure in doing things?  No  Activities of Daily Living In your present state of health, do you have any difficulty performing the following activities?:  Driving? No Managing money?  No Feeding yourself? No Getting from bed to chair? No Climbing a flight of stairs? No Preparing food and eating?: No Bathing or showering? No Getting dressed: No Getting to/using the toilet? No Moving around from place to place: No In the past year have you fallen or had a near fall?: No   Are you sexually active?   No  Do you have more than one partner?  N/A  Hearing Difficulties: No Do you often ask people to speak up or repeat themselves? No Do you experience ringing or noises in your ears? No Do you have difficulty understanding soft or whispered voices? No Vision:              Any change in vision:    no             Up to date with eye exam:   Yes, twice a year  Memory:  Do you feel that you have a problem with memory? No  Do you often misplace items? No  Do you feel safe at home?  Yes  Cognitive Testing  Alert, Orientated? Yes  Normal Appearance? Yes  Recall of three objects?  Yes  Can perform simple calculations? Yes  Displays appropriate judgment? Yes  Can read the correct time from a watch face? Yes   Advanced Directives have been discussed with the patient? Yes - has in place .     Medications and allergies reviewed with patient and updated if appropriate.  Patient Active Problem List   Diagnosis Date Noted  . Fuchs' corneal dystrophy 01/23/2018  . Hyperglycemia 01/23/2018  . Polyp of colon, hyperplastic 01/17/2014  . ANEMIA, MILD 04/08/2008  . METHICILLIN RESISTANT STAPHYLOCOCCUS AUREUS INFECTION 06/24/2006  . LIPOMAS, MULTIPLE 06/24/2006  . Hyperlipidemia 06/24/2006  . Essential hypertension 06/24/2006  . PROSTATE  CANCER, HX OF 06/24/2006    Current Outpatient Medications on File Prior to Visit  Medication Sig Dispense Refill  . amLODipine (NORVASC) 5 MG tablet Take 1 tablet (5 mg total) by mouth daily. 90 tablet 0  . aspirin 81 MG tablet Take 81 mg by mouth daily.      Marland Kitchen atorvastatin (LIPITOR) 20 MG tablet Take 1 tablet (20 mg total) by mouth daily. 90 tablet 0  . Ferrous Sulfate (IRON) 325 (65 FE) MG TABS Take by mouth daily.    Marland Kitchen losartan (COZAAR) 25 MG tablet Take 1 tablet (25 mg total) by mouth daily. 90 tablet 0  . Magnesium 250 MG TABS     . Multiple Vitamin (MULTIVITAMIN) tablet Take 1 tablet by mouth daily.      . Omega-3 Fatty Acids (FISH OIL) 1000 MG  CAPS Take by mouth 2 (two) times daily.       No current facility-administered medications on file prior to visit.     Past Medical History:  Diagnosis Date  . Anemia    blood donor  . Fuchs' corneal dystrophy    Dr. Patrice Paradise; [facility] , [place]  . Hyperlipidemia   . Hypertension   . Personal history of prostate cancer    Dr. Serita Butcher    Past Surgical History:  Procedure Laterality Date  . COLONOSCOPY  2009   Dr Fuller Plan, virtual colonoscopy recommended  in 2013 because of tortuous colon  . colonoscopy with polypectomy  2003  . [place] 2010   cataract & implant bilaterally  . PROSTATE SURGERY  1998   prostatectomy    Social History   Socioeconomic History  . Marital status: Married    Spouse name: Not on file  . Number of children: 1  . Years of education: Not on file  . Highest education level: Not on file  Occupational History  . Occupation: Retired  Scientific laboratory technician  . Financial resource strain: Not on file  . Food insecurity:    Worry: Not on file    Inability: Not on file  . Transportation needs:    Medical: Not on file    Non-medical: Not on file  Tobacco Use  . Smoking status: Never Smoker  . Smokeless tobacco: Never Used  Substance and Sexual Activity  . Alcohol use: Yes    Alcohol/week: 0.0 standard drinks    Comment:  seldom  . Drug use: No  . Sexual activity: Not on file  Lifestyle  . Physical activity:    Days per week: Not on file    Minutes per session: Not on file  . Stress: Not on file  Relationships  . Social connections:    Talks on phone: Not on file    Gets together: Not on file    Attends religious service: Not on file    Active member of club or organization: Not on file    Attends meetings of clubs or organizations: Not on file    Relationship status: Not on file  Other Topics Concern  . Not on file  Social History Narrative  . Not on file    Family History  Problem Relation Age of Onset  . Hypertension Mother    . Heart attack Mother        in 32s  . Atrial fibrillation Mother        Aortic Stenosis  . Anemia Mother        ? slow GI bleed  .  Lung cancer Father        smoker  . Breast cancer Sister   . Dementia Paternal Grandmother   . Stroke Neg Hx   . Diabetes Neg Hx     Review of Systems  Constitutional: Negative for chills and fever.  HENT: Negative for hearing loss and tinnitus.   Eyes: Negative for visual disturbance.  Respiratory: Negative for cough, shortness of breath and wheezing.   Cardiovascular: Negative for chest pain, palpitations and leg swelling.  Gastrointestinal: Negative for abdominal pain, blood in stool, constipation, diarrhea and nausea.       No gerd  Genitourinary: Negative for dysuria and hematuria.  Musculoskeletal: Positive for arthralgias (mild), back pain (ooc with long time standing) and gait problem (balance is not as good).  Skin: Negative for color change.  Neurological: Positive for numbness (seldom) and headaches (seldom). Negative for light-headedness.  Hematological: Bruises/bleeds easily.  Psychiatric/Behavioral: Negative for dysphoric mood and sleep disturbance. The patient is not nervous/anxious.        Objective:   Vitals:   01/23/18 0756  BP: 132/74  Pulse: 60  Resp: 16  Temp: 98.1 F (36.7 C)  SpO2: 98%   Filed Weights   01/23/18 0756  Weight: 175 lb 12.8 oz (79.7 kg)   Body mass index is 24.52 kg/m.  Wt Readings from Last 3 Encounters:  01/23/18 175 lb 12.8 oz (79.7 kg)  01/22/17 169 lb (76.7 kg)  01/22/16 171 lb (77.6 kg)     Physical Exam Constitutional: He appears well-developed and well-nourished. No distress.  HENT:  Head: Normocephalic and atraumatic.  Right Ear: External ear normal.  Left Ear: External ear normal.  Mouth/Throat: Oropharynx is clear and moist.  Normal ear canals and TM b/l  Eyes: Conjunctivae and EOM are normal.  Neck: Neck supple. No tracheal deviation present. No thyromegaly present.  No  carotid bruit  Cardiovascular: Normal rate, regular rhythm, normal heart sounds and intact distal pulses.   No murmur heard. Pulmonary/Chest: Effort normal and breath sounds normal. No respiratory distress. He has no wheezes. He has no rales.  Abdominal: Soft. He exhibits no distension. There is no tenderness.  Genitourinary: deferred  Musculoskeletal: He exhibits no edema.  Lymphadenopathy:   He has no cervical adenopathy.  Skin: Skin is warm and dry. He is not diaphoretic.  Psychiatric: He has a normal mood and affect. His behavior is normal.         Assessment & Plan:   Wellness Exam: Immunizations   Discussed shingles, flu vaccine today, others up to date Colonoscopy  No longer needed due to age Eye exam   Up to date Hearing loss   none Memory concerns/difficulties   None - normal short term memory loss for aging Independent of ADLs  fully Stressed the importance of regular exercise   Patient received copy of preventative screening tests/immunizations recommended for the next 5-10 years.   Physical exam: Screening blood work  ordered Immunizations   Discussed shingles, flu vaccine today, others up to date Colonoscopy  No longer needed due to age - will likely get cologuard this year Eye exams  Up to date EKG  Done 2014 Exercise  Walks regularly Weight  Normal BMI Skin   No concerns Substance abuse   none  See Problem List for Assessment and Plan of chronic medical problems.   FU in one year

## 2018-01-23 ENCOUNTER — Other Ambulatory Visit (INDEPENDENT_AMBULATORY_CARE_PROVIDER_SITE_OTHER): Payer: Medicare HMO

## 2018-01-23 ENCOUNTER — Ambulatory Visit (INDEPENDENT_AMBULATORY_CARE_PROVIDER_SITE_OTHER): Payer: Medicare HMO | Admitting: Internal Medicine

## 2018-01-23 ENCOUNTER — Encounter: Payer: Self-pay | Admitting: Internal Medicine

## 2018-01-23 VITALS — BP 132/74 | HR 60 | Temp 98.1°F | Resp 16 | Ht 71.0 in | Wt 175.8 lb

## 2018-01-23 DIAGNOSIS — I10 Essential (primary) hypertension: Secondary | ICD-10-CM

## 2018-01-23 DIAGNOSIS — Z8546 Personal history of malignant neoplasm of prostate: Secondary | ICD-10-CM | POA: Diagnosis not present

## 2018-01-23 DIAGNOSIS — Z23 Encounter for immunization: Secondary | ICD-10-CM | POA: Diagnosis not present

## 2018-01-23 DIAGNOSIS — R739 Hyperglycemia, unspecified: Secondary | ICD-10-CM

## 2018-01-23 DIAGNOSIS — E785 Hyperlipidemia, unspecified: Secondary | ICD-10-CM | POA: Diagnosis not present

## 2018-01-23 DIAGNOSIS — R7303 Prediabetes: Secondary | ICD-10-CM | POA: Insufficient documentation

## 2018-01-23 DIAGNOSIS — H18519 Endothelial corneal dystrophy, unspecified eye: Secondary | ICD-10-CM | POA: Insufficient documentation

## 2018-01-23 DIAGNOSIS — H1851 Endothelial corneal dystrophy: Secondary | ICD-10-CM

## 2018-01-23 DIAGNOSIS — Z0000 Encounter for general adult medical examination without abnormal findings: Secondary | ICD-10-CM

## 2018-01-23 LAB — COMPREHENSIVE METABOLIC PANEL
ALBUMIN: 4 g/dL (ref 3.5–5.2)
ALT: 24 U/L (ref 0–53)
AST: 25 U/L (ref 0–37)
Alkaline Phosphatase: 57 U/L (ref 39–117)
BILIRUBIN TOTAL: 0.5 mg/dL (ref 0.2–1.2)
BUN: 21 mg/dL (ref 6–23)
CALCIUM: 9.3 mg/dL (ref 8.4–10.5)
CO2: 28 meq/L (ref 19–32)
Chloride: 104 mEq/L (ref 96–112)
Creatinine, Ser: 1.19 mg/dL (ref 0.40–1.50)
GFR: 62.94 mL/min (ref >60.00)
Glucose, Bld: 96 mg/dL (ref 70–99)
Potassium: 4.1 mEq/L (ref 3.5–5.1)
Sodium: 139 mEq/L (ref 135–145)
Total Protein: 6.6 g/dL (ref 6.0–8.3)

## 2018-01-23 LAB — LIPID PANEL
Cholesterol: 138 mg/dL (ref 0–200)
HDL: 43.7 mg/dL (ref >39.00)
LDL Cholesterol: 82 mg/dL (ref 0–99)
NonHDL: 94.25
TRIGLYCERIDES: 61 mg/dL (ref 0.0–149.0)
Total CHOL/HDL Ratio: 3
VLDL: 12.2 mg/dL (ref 0.0–40.0)

## 2018-01-23 LAB — CBC WITH DIFFERENTIAL/PLATELET
BASOS ABS: 0.1 10*3/uL (ref 0.0–0.1)
Basophils Relative: 0.7 % (ref 0.0–3.0)
Eosinophils Absolute: 0.2 10*3/uL (ref 0.0–0.7)
Eosinophils Relative: 3 % (ref 0.0–5.0)
HEMATOCRIT: 42.4 % (ref 39.0–52.0)
HEMOGLOBIN: 14.5 g/dL (ref 13.0–17.0)
LYMPHS PCT: 21.9 % (ref 12.0–46.0)
Lymphs Abs: 1.6 10*3/uL (ref 0.7–4.0)
MCHC: 34.2 g/dL (ref 30.0–36.0)
MCV: 91.9 fl (ref 78.0–100.0)
MONOS PCT: 7.7 % (ref 3.0–12.0)
Monocytes Absolute: 0.6 10*3/uL (ref 0.1–1.0)
Neutro Abs: 5 10*3/uL (ref 1.4–7.7)
Neutrophils Relative %: 66.7 % (ref 43.0–77.0)
PLATELETS: 196 10*3/uL (ref 150.0–400.0)
RBC: 4.61 Mil/uL (ref 4.22–5.81)
RDW: 13 % (ref 11.5–15.5)
WBC: 7.5 10*3/uL (ref 4.0–10.5)

## 2018-01-23 LAB — TSH: TSH: 3 u[IU]/mL (ref 0.35–4.50)

## 2018-01-23 LAB — HEMOGLOBIN A1C: Hgb A1c MFr Bld: 5.9 % (ref 4.6–6.5)

## 2018-01-23 NOTE — Assessment & Plan Note (Signed)
Check lipid panel  Continue daily statin Regular exercise and healthy diet encouraged  

## 2018-01-23 NOTE — Assessment & Plan Note (Signed)
a1c

## 2018-01-23 NOTE — Assessment & Plan Note (Signed)
BP well controlled Current regimen effective and well tolerated Continue current medications at current doses cmp  

## 2018-01-23 NOTE — Assessment & Plan Note (Signed)
No longer seeing urology Deferred psa today - has been 0 - may considering checking next year

## 2018-01-24 ENCOUNTER — Encounter: Payer: Self-pay | Admitting: Internal Medicine

## 2018-02-24 DIAGNOSIS — R69 Illness, unspecified: Secondary | ICD-10-CM | POA: Diagnosis not present

## 2018-03-16 ENCOUNTER — Other Ambulatory Visit: Payer: Self-pay | Admitting: Internal Medicine

## 2018-03-16 MED ORDER — LOSARTAN POTASSIUM 25 MG PO TABS: 25.0000 mg | ORAL_TABLET | Freq: Every day | ORAL | 1 refills | Status: DC

## 2018-03-16 MED ORDER — ATORVASTATIN CALCIUM 20 MG PO TABS: 20.0000 mg | ORAL_TABLET | Freq: Every day | ORAL | 1 refills | Status: DC

## 2018-03-16 MED ORDER — AMLODIPINE BESYLATE 5 MG PO TABS: 5.0000 mg | ORAL_TABLET | Freq: Every day | ORAL | 1 refills | Status: DC

## 2018-04-06 DIAGNOSIS — H43813 Vitreous degeneration, bilateral: Secondary | ICD-10-CM | POA: Diagnosis not present

## 2018-04-06 DIAGNOSIS — H1851 Endothelial corneal dystrophy: Secondary | ICD-10-CM | POA: Diagnosis not present

## 2018-04-06 DIAGNOSIS — Z961 Presence of intraocular lens: Secondary | ICD-10-CM | POA: Diagnosis not present

## 2018-04-06 DIAGNOSIS — H43392 Other vitreous opacities, left eye: Secondary | ICD-10-CM | POA: Diagnosis not present

## 2018-04-06 DIAGNOSIS — H04123 Dry eye syndrome of bilateral lacrimal glands: Secondary | ICD-10-CM | POA: Diagnosis not present

## 2018-09-15 ENCOUNTER — Encounter: Payer: Self-pay | Admitting: General Surgery

## 2018-09-16 ENCOUNTER — Other Ambulatory Visit: Payer: Self-pay

## 2018-09-16 ENCOUNTER — Encounter: Payer: Medicare HMO | Admitting: Gastroenterology

## 2018-09-17 NOTE — Progress Notes (Signed)
This encounter was created in error - please disregard.

## 2018-09-22 ENCOUNTER — Encounter: Payer: Self-pay | Admitting: General Surgery

## 2018-09-23 ENCOUNTER — Encounter: Payer: Self-pay | Admitting: Gastroenterology

## 2018-09-23 ENCOUNTER — Ambulatory Visit (INDEPENDENT_AMBULATORY_CARE_PROVIDER_SITE_OTHER): Payer: Medicare HMO | Admitting: Gastroenterology

## 2018-09-23 VITALS — Ht 66.75 in | Wt 180.0 lb

## 2018-09-23 DIAGNOSIS — Z1212 Encounter for screening for malignant neoplasm of rectum: Secondary | ICD-10-CM | POA: Diagnosis not present

## 2018-09-23 DIAGNOSIS — R933 Abnormal findings on diagnostic imaging of other parts of digestive tract: Secondary | ICD-10-CM | POA: Diagnosis not present

## 2018-09-23 DIAGNOSIS — Z1211 Encounter for screening for malignant neoplasm of colon: Secondary | ICD-10-CM

## 2018-09-23 NOTE — Progress Notes (Signed)
History of Present Illness: This is a 78 year old male referred by Ronald Rail, MD for the evaluation of CRC screening.  He has no gastrointestinal complaints.  He previously underwent screening colonoscopy in May 2009 which was incomplete, to the hepatic flexure.  Internal and external hemorrhoids were noted.  Subsequent ACBE was performed in May 2009 which was a limited study due to a redundant colon and a large atonic sigmoid colon.  The cecum and ascending colon were not well examined.  Large lesions, strictures, obstructions were felt to be excluded.  Denies weight loss, abdominal pain, constipation, diarrhea, change in stool caliber, melena, hematochezia, nausea, vomiting, dysphagia, reflux symptoms, chest pain.    Allergies  Allergen Reactions  . Quinapril Hcl     REACTION: cough   Outpatient Medications Prior to Visit  Medication Sig Dispense Refill  . amLODipine (NORVASC) 5 MG tablet Take 1 tablet (5 mg total) by mouth daily. 90 tablet 1  . aspirin 81 MG tablet Take 81 mg by mouth daily.      Marland Kitchen atorvastatin (LIPITOR) 20 MG tablet Take 1 tablet (20 mg total) by mouth daily. 90 tablet 1  . Ferrous Sulfate (IRON) 325 (65 FE) MG TABS Take by mouth daily.    Marland Kitchen losartan (COZAAR) 25 MG tablet Take 1 tablet (25 mg total) by mouth daily. 90 tablet 1  . Magnesium 250 MG TABS     . Multiple Vitamin (MULTIVITAMIN) tablet Take 1 tablet by mouth daily.      . Omega-3 Fatty Acids (FISH OIL) 1000 MG CAPS Take by mouth 2 (two) times daily.       No facility-administered medications prior to visit.    Past Medical History:  Diagnosis Date  . Anemia    blood donor  . Fuchs' corneal dystrophy    Dr. Patrice Paradise; [facility] , [place]  . Hyperlipidemia   . Hypertension   . Personal history of prostate cancer    Dr. Serita Butcher   Past Surgical History:  Procedure Laterality Date  . COLONOSCOPY  2009   Dr Fuller Plan, virtual colonoscopy recommended  in 2013 because of tortuous colon  .  colonoscopy with polypectomy  2003  . [place] 2010   cataract & implant bilaterally  . PROSTATE SURGERY  1998   prostatectomy   Social History   Socioeconomic History  . Marital status: Married    Spouse name: Not on file  . Number of children: 1  . Years of education: Not on file  . Highest education level: Not on file  Occupational History  . Occupation: Retired  Scientific laboratory technician  . Financial resource strain: Not on file  . Food insecurity:    Worry: Not on file    Inability: Not on file  . Transportation needs:    Medical: Not on file    Non-medical: Not on file  Tobacco Use  . Smoking status: Never Smoker  . Smokeless tobacco: Never Used  Substance and Sexual Activity  . Alcohol use: Yes    Alcohol/week: 0.0 standard drinks    Comment:  seldom  . Drug use: No  . Sexual activity: Not on file  Lifestyle  . Physical activity:    Days per week: Not on file    Minutes per session: Not on file  . Stress: Not on file  Relationships  . Social connections:    Talks on phone: Not on file    Gets together: Not  on file    Attends religious service: Not on file    Active member of club or organization: Not on file    Attends meetings of clubs or organizations: Not on file    Relationship status: Not on file  Other Topics Concern  . Not on file  Social History Narrative  . Not on file   Family History  Problem Relation Age of Onset  . Hypertension Mother   . Heart attack Mother        in 82s  . Atrial fibrillation Mother        Aortic Stenosis  . Anemia Mother        ? slow GI bleed  . Lung cancer Father        smoker  . Breast cancer Sister   . Dementia Paternal Grandmother   . Stroke Neg Hx   . Diabetes Neg Hx        Review of Systems: Pertinent positive and negative review of systems were noted in the above HPI section. All other review of systems were otherwise negative.    Physical Exam: Telemedicine - not performed     Assessment and  Recommendations:  1.  CRC screening, average risk.  Prior incomplete colonoscopy due to a dilated and redundant sigmoid colon. Subsequent ACBE was also limited for the same reason.  We discussed Cologuard, FIT, colonoscopy, barium enema, CT colonoscopy or no screening.  After discussion of the options he wishes to proceed with Cologuard.  He understands if it is positive colonoscopy will be the next step which could again be technically challenging or an incomplete study.   These services were provided via telemedicine, audio only after attempts made at audio and visual were unsuccessful.  The patient was at home and the provider was in the office, alone.  We discussed the limitations of evaluation and management by telemedicine and the availability of in person appointments.  Patient consented for this telemedicine visit and is aware of possible charges for this service.  The other person participating in the telemedicine service was Marlon Pel, [place] who reviewed medications, allergies, past history and completed AVS.  Time spent on call: 8 minutes     cc: Ronald Rail, MD [address] [place], [place] [address]

## 2018-09-23 NOTE — Patient Instructions (Signed)
Your provider has ordered Cologuard testing as an option for colon cancer screening. This is performed by [facility] and may be out of network with your insurance. PRIOR to completing the test, it is YOUR responsibility to contact your insurance about covered benefits for this test. Your out of pocket expense could be anywhere from $0.00 to $649.00.   When you call to check coverage with your insurer, please provide the following information:   -The ONLY provider of Cologuard is [facility]  - CPT code for Cologuard is 81528.  -[facility] NPI # [id number]  -[facility] Tax ID # [id number]   We have already sent your demographic and insurance information to [facility] (phone number [phone]) and they should contact you within the next week regarding your test. If you have not heard from them within the next week, please call our office at [phone].    

## 2018-09-29 ENCOUNTER — Encounter: Payer: Self-pay | Admitting: Internal Medicine

## 2018-09-30 ENCOUNTER — Other Ambulatory Visit: Payer: Self-pay

## 2018-09-30 MED ORDER — AMLODIPINE BESYLATE 5 MG PO TABS: 5.0000 mg | ORAL_TABLET | Freq: Every day | ORAL | 0 refills | Status: DC

## 2018-09-30 MED ORDER — ATORVASTATIN CALCIUM 20 MG PO TABS: 20.0000 mg | ORAL_TABLET | Freq: Every day | ORAL | 0 refills | Status: DC

## 2018-09-30 MED ORDER — LOSARTAN POTASSIUM 25 MG PO TABS: 25.0000 mg | ORAL_TABLET | Freq: Every day | ORAL | 0 refills | Status: DC

## 2018-10-01 DIAGNOSIS — Z1211 Encounter for screening for malignant neoplasm of colon: Secondary | ICD-10-CM | POA: Diagnosis not present

## 2018-10-01 DIAGNOSIS — Z1212 Encounter for screening for malignant neoplasm of rectum: Secondary | ICD-10-CM | POA: Diagnosis not present

## 2018-10-08 ENCOUNTER — Other Ambulatory Visit: Payer: Self-pay

## 2018-10-08 LAB — COLOGUARD: Cologuard: NEGATIVE

## 2019-01-21 ENCOUNTER — Encounter: Payer: Self-pay | Admitting: Internal Medicine

## 2019-01-24 NOTE — Patient Instructions (Addendum)
Tests ordered today. Your results will be released to [place] (or called to you) after review.  If any changes need to be made, you will be notified at that same time.  All other Health Maintenance issues reviewed.   All recommended immunizations and age-appropriate screenings are up-to-date or discussed.  Flu immunization administered today.    Medications reviewed and updated.  Changes include : none    Your prescription(s) have been submitted to your pharmacy. Please take as directed and contact our office if you believe you are having problem(s) with the medication(s).   Please followup in 1 year    Health Maintenance, Male Adopting a healthy lifestyle and getting preventive care are important in promoting health and wellness. Ask your health care provider about:  The right schedule for you to have regular tests and exams.  Things you can do on your own to prevent diseases and keep yourself healthy. What should I know about diet, weight, and exercise? Eat a healthy diet   Eat a diet that includes plenty of vegetables, fruits, low-fat dairy products, and lean protein.  Do not eat a lot of foods that are high in solid fats, added sugars, or sodium. Maintain a healthy weight Body mass index (BMI) is a measurement that can be used to identify possible weight problems. It estimates body fat based on height and weight. Your health care provider can help determine your BMI and help you achieve or maintain a healthy weight. Get regular exercise Get regular exercise. This is one of the most important things you can do for your health. Most adults should:  Exercise for at least 150 minutes each week. The exercise should increase your heart rate and make you sweat (moderate-intensity exercise).  Do strengthening exercises at least twice a week. This is in addition to the moderate-intensity exercise.  Spend less time sitting. Even light physical activity can be beneficial. Watch  cholesterol and blood lipids Have your blood tested for lipids and cholesterol at 78 years of age, then have this test every 5 years. You may need to have your cholesterol levels checked more often if:  Your lipid or cholesterol levels are high.  You are older than 78 years of age.  You are at high risk for heart disease. What should I know about cancer screening? Many types of cancers can be detected early and may often be prevented. Depending on your health history and family history, you may need to have cancer screening at various ages. This may include screening for:  Colorectal cancer.  Prostate cancer.  Skin cancer.  Lung cancer. What should I know about heart disease, diabetes, and high blood pressure? Blood pressure and heart disease  High blood pressure causes heart disease and increases the risk of stroke. This is more likely to develop in people who have high blood pressure readings, are of African descent, or are overweight.  Talk with your health care provider about your target blood pressure readings.  Have your blood pressure checked: ? Every 3-5 years if you are 9-84 years of age. ? Every year if you are 79 years old or older.  If you are between the ages of 31 and 77 and are a current or former smoker, ask your health care provider if you should have a one-time screening for abdominal aortic aneurysm (AAA). Diabetes Have regular diabetes screenings. This checks your fasting blood sugar level. Have the screening done:  Once every three years after age 72 if you are  at a normal weight and have a low risk for diabetes.  More often and at a younger age if you are overweight or have a high risk for diabetes. What should I know about preventing infection? Hepatitis B If you have a higher risk for hepatitis B, you should be screened for this virus. Talk with your health care provider to find out if you are at risk for hepatitis B infection. Hepatitis C Blood  testing is recommended for:  Everyone born from [birth year] through 1965.  Anyone with known risk factors for hepatitis C. Sexually transmitted infections (STIs)  You should be screened each year for STIs, including gonorrhea and chlamydia, if: ? You are sexually active and are younger than 78 years of age. ? You are older than 78 years of age and your health care provider tells you that you are at risk for this type of infection. ? Your sexual activity has changed since you were last screened, and you are at increased risk for chlamydia or gonorrhea. Ask your health care provider if you are at risk.  Ask your health care provider about whether you are at high risk for HIV. Your health care provider may recommend a prescription medicine to help prevent HIV infection. If you choose to take medicine to prevent HIV, you should first get tested for HIV. You should then be tested every 3 months for as long as you are taking the medicine. Follow these instructions at home: Lifestyle  Do not use any products that contain nicotine or tobacco, such as cigarettes, e-cigarettes, and chewing tobacco. If you need help quitting, ask your health care provider.  Do not use street drugs.  Do not share needles.  Ask your health care provider for help if you need support or information about quitting drugs. Alcohol use  Do not drink alcohol if your health care provider tells you not to drink.  If you drink alcohol: ? Limit how much you have to 0-2 drinks a day. ? Be aware of how much alcohol is in your drink. In the [place] one drink equals one 12 oz bottle of beer (355 mL), one 5 oz glass of wine (148 mL), or one 1 oz glass of hard liquor (44 mL). General instructions  Schedule regular health, dental, and eye exams.  Stay current with your vaccines.  Tell your health care provider if: ? You often feel depressed. ? You have ever been abused or do not feel safe at home. Summary  Adopting a healthy  lifestyle and getting preventive care are important in promoting health and wellness.  Follow your health care provider's instructions about healthy diet, exercising, and getting tested or screened for diseases.  Follow your health care provider's instructions on monitoring your cholesterol and blood pressure. This information is not intended to replace advice given to you by your health care provider. Make sure you discuss any questions you have with your health care provider. Document Released: 10/19/2007 Document Revised: 04/15/2018 Document Reviewed: 04/15/2018 Elsevier Patient Education  2020 [employer].

## 2019-01-24 NOTE — Progress Notes (Signed)
Subjective:    Patient ID: Ronald Ross, male    DOB: 1940/07/31, 78 y.o.   MRN: [id number]  HPI He is here for a physical exam.   He denies any changes in his health.  He has no major concerns.  Typically his blood pressure is better controlled.  He does not monitor it at home, but states that he could start.  He has been exercising, but not as much as usual.  He thinks he probably has gained some weight due to sitting around more.  Medications and allergies reviewed with patient and updated if appropriate.  Patient Active Problem List   Diagnosis Date Noted  . Fuchs' corneal dystrophy 01/23/2018  . Prediabetes 01/23/2018  . Polyp of colon, hyperplastic 01/17/2014  . ANEMIA, MILD 04/08/2008  . METHICILLIN RESISTANT STAPHYLOCOCCUS AUREUS INFECTION 06/24/2006  . LIPOMAS, MULTIPLE 06/24/2006  . Hyperlipidemia 06/24/2006  . Essential hypertension 06/24/2006  . PROSTATE CANCER, HX OF 06/24/2006    Current Outpatient Medications on File Prior to Visit  Medication Sig Dispense Refill  . amLODipine (NORVASC) 5 MG tablet Take 1 tablet (5 mg total) by mouth daily. 90 tablet 0  . aspirin 81 MG tablet Take 81 mg by mouth daily.      Marland Kitchen atorvastatin (LIPITOR) 20 MG tablet Take 1 tablet (20 mg total) by mouth daily. 90 tablet 0  . Ferrous Sulfate (IRON) 325 (65 FE) MG TABS Take by mouth daily.    Marland Kitchen losartan (COZAAR) 25 MG tablet Take 1 tablet (25 mg total) by mouth daily. 90 tablet 0  . Magnesium 250 MG TABS     . Multiple Vitamin (MULTIVITAMIN) tablet Take 1 tablet by mouth daily.      . Omega-3 Fatty Acids (FISH OIL) 1000 MG CAPS Take by mouth 2 (two) times daily.       No current facility-administered medications on file prior to visit.     Past Medical History:  Diagnosis Date  . Anemia    blood donor  . Fuchs' corneal dystrophy    Dr. Patrice Paradise; [facility] , [place]  . Hyperlipidemia   . Hypertension   . Personal history of prostate cancer    Dr. Serita Butcher    Past  Surgical History:  Procedure Laterality Date  . COLONOSCOPY  2009   Dr Fuller Plan, virtual colonoscopy recommended  in 2013 because of tortuous colon  . colonoscopy with polypectomy  2003  . [place] 2010   cataract & implant bilaterally  . PROSTATE SURGERY  1998   prostatectomy    Social History   Socioeconomic History  . Marital status: Married    Spouse name: Not on file  . Number of children: 1  . Years of education: Not on file  . Highest education level: Not on file  Occupational History  . Occupation: Retired  Scientific laboratory technician  . Financial resource strain: Not on file  . Food insecurity    Worry: Not on file    Inability: Not on file  . Transportation needs    Medical: Not on file    Non-medical: Not on file  Tobacco Use  . Smoking status: Never Smoker  . Smokeless tobacco: Never Used  Substance and Sexual Activity  . Alcohol use: Yes    Alcohol/week: 0.0 standard drinks    Comment:  seldom  . Drug use: No  . Sexual activity: Not on file  Lifestyle  . Physical activity    Days per  week: Not on file    Minutes per session: Not on file  . Stress: Not on file  Relationships  . Social Herbalist on phone: Not on file    Gets together: Not on file    Attends religious service: Not on file    Active member of club or organization: Not on file    Attends meetings of clubs or organizations: Not on file    Relationship status: Not on file  Other Topics Concern  . Not on file  Social History Narrative  . Not on file    Family History  Problem Relation Age of Onset  . Hypertension Mother   . Heart attack Mother        in 56s  . Atrial fibrillation Mother        Aortic Stenosis  . Anemia Mother        ? slow GI bleed  . Lung cancer Father        smoker  . Breast cancer Sister   . Dementia Paternal Grandmother   . Stroke Neg Hx   . Diabetes Neg Hx     Review of Systems  Constitutional: Negative for chills and fever.  Eyes: Negative for  visual disturbance.  Respiratory: Positive for cough (occ, dry cough - tickle in throat). Negative for shortness of breath and wheezing.   Cardiovascular: Negative for chest pain, palpitations and leg swelling.  Gastrointestinal: Positive for constipation (colace prn). Negative for abdominal pain, blood in stool, diarrhea and nausea.       Rare GERD  Genitourinary: Negative for difficulty urinating, dysuria and hematuria.  Musculoskeletal: Positive for arthralgias (mild).  Skin: Negative for color change and rash.  Neurological: Negative for light-headedness, numbness and headaches.  Hematological: Bruises/bleeds easily.  Psychiatric/Behavioral: Positive for dysphoric mood (mild). The patient is not nervous/anxious.        Objective:   Vitals:   01/25/19 0827  BP: (!) 156/82  Pulse: 61  Resp: 16  Temp: 98.3 F (36.8 C)  SpO2: 99%   Filed Weights   01/25/19 0827  Weight: 182 lb (82.6 kg)   Body mass index is 28.72 kg/m.  BP Readings from Last 3 Encounters:  01/25/19 (!) 156/82  01/23/18 132/74  01/22/17 124/86    Wt Readings from Last 3 Encounters:  01/25/19 182 lb (82.6 kg)  09/23/18 180 lb (81.6 kg)  09/22/18 180 lb (81.6 kg)     Physical Exam Constitutional: He appears well-developed and well-nourished. No distress.  HENT:  Head: Normocephalic and atraumatic.  Right Ear: External ear normal.  Left Ear: External ear normal.  Mouth/Throat: Oropharynx is clear and moist.  Normal ear canals and TM b/l  Eyes: Conjunctivae and EOM are normal.  Neck: Neck supple. No tracheal deviation present. No thyromegaly present. No carotid bruit  Cardiovascular: Normal rate, regular rhythm, normal heart sounds and intact distal pulses.  No murmur heard. Pulmonary/Chest: Effort normal and breath sounds normal. No respiratory distress. He has no wheezes. He has no rales.  Abdominal: Soft. He exhibits no distension. There is no tenderness.  Genitourinary: deferred   Musculoskeletal: He exhibits no edema.  Lymphadenopathy:   He has no cervical adenopathy.  Skin: Skin is warm and dry. He is not diaphoretic.  Psychiatric: He has a normal mood and affect. His behavior is normal.         Assessment & Plan:   Physical exam: Screening blood work  ordered Immunizations  Flu vaccine today, discussed shingrix Colonoscopy  - no longer needed due to age Eye exams     Up to date  Exercise     Walking regularly-not as much as usual, but still regular Weight   BMI  Ok for age-he has gained couple of pounds in the past year.  Encouraged weight loss Substance abuse   none  See Problem List for Assessment and Plan of chronic medical problems.  Follow-up in 1 year

## 2019-01-25 ENCOUNTER — Encounter: Payer: Self-pay | Admitting: Internal Medicine

## 2019-01-25 ENCOUNTER — Ambulatory Visit (INDEPENDENT_AMBULATORY_CARE_PROVIDER_SITE_OTHER): Payer: Medicare HMO | Admitting: Internal Medicine

## 2019-01-25 ENCOUNTER — Other Ambulatory Visit (INDEPENDENT_AMBULATORY_CARE_PROVIDER_SITE_OTHER): Payer: Medicare HMO

## 2019-01-25 ENCOUNTER — Other Ambulatory Visit: Payer: Self-pay

## 2019-01-25 VITALS — BP 156/82 | HR 61 | Temp 98.3°F | Resp 16 | Ht 66.75 in | Wt 182.0 lb

## 2019-01-25 DIAGNOSIS — Z8546 Personal history of malignant neoplasm of prostate: Secondary | ICD-10-CM | POA: Diagnosis not present

## 2019-01-25 DIAGNOSIS — R7303 Prediabetes: Secondary | ICD-10-CM

## 2019-01-25 DIAGNOSIS — E785 Hyperlipidemia, unspecified: Secondary | ICD-10-CM

## 2019-01-25 DIAGNOSIS — Z23 Encounter for immunization: Secondary | ICD-10-CM

## 2019-01-25 DIAGNOSIS — Z0000 Encounter for general adult medical examination without abnormal findings: Secondary | ICD-10-CM

## 2019-01-25 DIAGNOSIS — I10 Essential (primary) hypertension: Secondary | ICD-10-CM | POA: Diagnosis not present

## 2019-01-25 LAB — COMPREHENSIVE METABOLIC PANEL
ALT: 23 U/L (ref 0–53)
AST: 24 U/L (ref 0–37)
Albumin: 4 g/dL (ref 3.5–5.2)
Alkaline Phosphatase: 61 U/L (ref 39–117)
BUN: 19 mg/dL (ref 6–23)
CO2: 28 mEq/L (ref 19–32)
Calcium: 9.3 mg/dL (ref 8.4–10.5)
Chloride: 105 mEq/L (ref 96–112)
Creatinine, Ser: 1.13 mg/dL (ref 0.40–1.50)
GFR: 62.69 mL/min (ref >60.00)
Glucose, Bld: 90 mg/dL (ref 70–99)
Potassium: 4.5 mEq/L (ref 3.5–5.1)
Sodium: 140 mEq/L (ref 135–145)
Total Bilirubin: 0.5 mg/dL (ref 0.2–1.2)
Total Protein: 6.1 g/dL (ref 6.0–8.3)

## 2019-01-25 LAB — CBC WITH DIFFERENTIAL/PLATELET
Basophils Absolute: 0.1 10*3/uL (ref 0.0–0.1)
Basophils Relative: 0.9 % (ref 0.0–3.0)
Eosinophils Absolute: 0.2 10*3/uL (ref 0.0–0.7)
Eosinophils Relative: 2.8 % (ref 0.0–5.0)
HCT: 43.2 % (ref 39.0–52.0)
Hemoglobin: 14.6 g/dL (ref 13.0–17.0)
Lymphocytes Relative: 33 % (ref 12.0–46.0)
Lymphs Abs: 2.1 10*3/uL (ref 0.7–4.0)
MCHC: 33.8 g/dL (ref 30.0–36.0)
MCV: 93.1 fl (ref 78.0–100.0)
Monocytes Absolute: 0.5 10*3/uL (ref 0.1–1.0)
Monocytes Relative: 7.1 % (ref 3.0–12.0)
Neutro Abs: 3.7 10*3/uL (ref 1.4–7.7)
Neutrophils Relative %: 56.2 % (ref 43.0–77.0)
Platelets: 194 10*3/uL (ref 150.0–400.0)
RBC: 4.64 Mil/uL (ref 4.22–5.81)
RDW: 13.3 % (ref 11.5–15.5)
WBC: 6.5 10*3/uL (ref 4.0–10.5)

## 2019-01-25 LAB — LIPID PANEL
Cholesterol: 144 mg/dL (ref 0–200)
HDL: 43.9 mg/dL (ref >39.00)
LDL Cholesterol: 90 mg/dL (ref 0–99)
NonHDL: 99.63
Total CHOL/HDL Ratio: 3
Triglycerides: 50 mg/dL (ref 0.0–149.0)
VLDL: 10 mg/dL (ref 0.0–40.0)

## 2019-01-25 LAB — HEMOGLOBIN A1C: Hgb A1c MFr Bld: 5.8 % (ref 4.6–6.5)

## 2019-01-25 LAB — PSA, MEDICARE: PSA: 0 ng/ml — ABNORMAL LOW (ref 0.10–4.00)

## 2019-01-25 LAB — TSH: TSH: 3.2 u[IU]/mL (ref 0.35–4.50)

## 2019-01-25 MED ORDER — ATORVASTATIN CALCIUM 20 MG PO TABS: 20.0000 mg | ORAL_TABLET | Freq: Every day | ORAL | 1 refills | Status: DC

## 2019-01-25 MED ORDER — AMLODIPINE BESYLATE 5 MG PO TABS: 5.0000 mg | ORAL_TABLET | Freq: Every day | ORAL | 1 refills | Status: DC

## 2019-01-25 MED ORDER — LOSARTAN POTASSIUM 25 MG PO TABS: 25.0000 mg | ORAL_TABLET | Freq: Every day | ORAL | 1 refills | Status: DC

## 2019-01-25 NOTE — Assessment & Plan Note (Signed)
Check a1c Low sugar / carb diet Stressed regular exercise   

## 2019-01-25 NOTE — Assessment & Plan Note (Signed)
Check lipid panel, CMP, TSH Continue daily statin Regular exercise and healthy diet encouraged  

## 2019-01-25 NOTE — Assessment & Plan Note (Signed)
No longer following with urology Will check PSA

## 2019-01-25 NOTE — Assessment & Plan Note (Signed)
Typically well controlled Elevated here He will start monitoring at home Continue current medications Continue regular exercise Encourage weight loss CMP

## 2019-01-26 ENCOUNTER — Encounter: Payer: Self-pay | Admitting: Internal Medicine

## 2019-01-28 ENCOUNTER — Encounter: Payer: Self-pay | Admitting: Internal Medicine

## 2019-03-04 ENCOUNTER — Telehealth: Payer: Self-pay | Admitting: Internal Medicine

## 2019-03-04 NOTE — Telephone Encounter (Signed)
Called patient to schedule AWV, but no answer. Will try to call patient back at a later time. SF 

## 2019-03-08 DIAGNOSIS — R69 Illness, unspecified: Secondary | ICD-10-CM | POA: Diagnosis not present

## 2019-03-11 DIAGNOSIS — R69 Illness, unspecified: Secondary | ICD-10-CM | POA: Diagnosis not present

## 2019-04-12 DIAGNOSIS — H04123 Dry eye syndrome of bilateral lacrimal glands: Secondary | ICD-10-CM | POA: Diagnosis not present

## 2019-04-12 DIAGNOSIS — Z961 Presence of intraocular lens: Secondary | ICD-10-CM | POA: Diagnosis not present

## 2019-04-12 DIAGNOSIS — H18513 Endothelial corneal dystrophy, bilateral: Secondary | ICD-10-CM | POA: Diagnosis not present

## 2019-04-12 DIAGNOSIS — H43813 Vitreous degeneration, bilateral: Secondary | ICD-10-CM | POA: Diagnosis not present

## 2019-04-12 DIAGNOSIS — H43392 Other vitreous opacities, left eye: Secondary | ICD-10-CM | POA: Diagnosis not present

## 2019-04-12 DIAGNOSIS — H02831 Dermatochalasis of right upper eyelid: Secondary | ICD-10-CM | POA: Diagnosis not present

## 2019-04-12 DIAGNOSIS — H35373 Puckering of macula, bilateral: Secondary | ICD-10-CM | POA: Diagnosis not present

## 2019-04-12 DIAGNOSIS — H02834 Dermatochalasis of left upper eyelid: Secondary | ICD-10-CM | POA: Diagnosis not present

## 2019-04-14 ENCOUNTER — Other Ambulatory Visit: Payer: Self-pay

## 2019-04-14 ENCOUNTER — Encounter (INDEPENDENT_AMBULATORY_CARE_PROVIDER_SITE_OTHER): Payer: Medicare HMO | Admitting: Ophthalmology

## 2019-04-14 DIAGNOSIS — I10 Essential (primary) hypertension: Secondary | ICD-10-CM

## 2019-04-14 DIAGNOSIS — H35371 Puckering of macula, right eye: Secondary | ICD-10-CM

## 2019-04-14 DIAGNOSIS — H35033 Hypertensive retinopathy, bilateral: Secondary | ICD-10-CM

## 2019-04-14 DIAGNOSIS — H43813 Vitreous degeneration, bilateral: Secondary | ICD-10-CM | POA: Diagnosis not present

## 2019-08-16 ENCOUNTER — Other Ambulatory Visit: Payer: Self-pay | Admitting: Internal Medicine

## 2019-09-21 DIAGNOSIS — R69 Illness, unspecified: Secondary | ICD-10-CM | POA: Diagnosis not present

## 2019-12-08 ENCOUNTER — Other Ambulatory Visit: Payer: Self-pay | Admitting: Internal Medicine

## 2019-12-08 MED ORDER — LOSARTAN POTASSIUM 25 MG PO TABS: 25.0000 mg | ORAL_TABLET | Freq: Every day | ORAL | 0 refills | Status: DC

## 2019-12-08 MED ORDER — ATORVASTATIN CALCIUM 20 MG PO TABS: 20.0000 mg | ORAL_TABLET | Freq: Every day | ORAL | 0 refills | Status: DC

## 2019-12-08 MED ORDER — AMLODIPINE BESYLATE 5 MG PO TABS: 5.0000 mg | ORAL_TABLET | Freq: Every day | ORAL | 0 refills | Status: DC

## 2020-01-25 NOTE — Patient Instructions (Addendum)
Consider getting a tetanus and shingles vaccine at the pharmacy.    Blood work was ordered.    All other Health Maintenance issues reviewed.   All recommended immunizations and age-appropriate screenings are up-to-date or discussed.  Flu immunization administered today.    Medications reviewed and updated.  Changes include :  none   Your prescription(s) have been submitted to your pharmacy. Please take as directed and contact our office if you believe you are having problem(s) with the medication(s).   Please followup in 1 year    Health Maintenance, Male Adopting a healthy lifestyle and getting preventive care are important in promoting health and wellness. Ask your health care provider about:  The right schedule for you to have regular tests and exams.  Things you can do on your own to prevent diseases and keep yourself healthy. What should I know about diet, weight, and exercise? Eat a healthy diet   Eat a diet that includes plenty of vegetables, fruits, low-fat dairy products, and lean protein.  Do not eat a lot of foods that are high in solid fats, added sugars, or sodium. Maintain a healthy weight Body mass index (BMI) is a measurement that can be used to identify possible weight problems. It estimates body fat based on height and weight. Your health care provider can help determine your BMI and help you achieve or maintain a healthy weight. Get regular exercise Get regular exercise. This is one of the most important things you can do for your health. Most adults should:  Exercise for at least 150 minutes each week. The exercise should increase your heart rate and make you sweat (moderate-intensity exercise).  Do strengthening exercises at least twice a week. This is in addition to the moderate-intensity exercise.  Spend less time sitting. Even light physical activity can be beneficial. Watch cholesterol and blood lipids Have your blood tested for lipids and  cholesterol at 80 years of age, then have this test every 5 years. You may need to have your cholesterol levels checked more often if:  Your lipid or cholesterol levels are high.  You are older than 79 years of age.  You are at high risk for heart disease. What should I know about cancer screening? Many types of cancers can be detected early and may often be prevented. Depending on your health history and family history, you may need to have cancer screening at various ages. This may include screening for:  Colorectal cancer.  Prostate cancer.  Skin cancer.  Lung cancer. What should I know about heart disease, diabetes, and high blood pressure? Blood pressure and heart disease  High blood pressure causes heart disease and increases the risk of stroke. This is more likely to develop in people who have high blood pressure readings, are of African descent, or are overweight.  Talk with your health care provider about your target blood pressure readings.  Have your blood pressure checked: ? Every 3-5 years if you are 64-44 years of age. ? Every year if you are 33 years old or older.  If you are between the ages of 60 and 28 and are a current or former smoker, ask your health care provider if you should have a one-time screening for abdominal aortic aneurysm (AAA). Diabetes Have regular diabetes screenings. This checks your fasting blood sugar level. Have the screening done:  Once every three years after age 51 if you are at a normal weight and have a low risk for diabetes.  More  often and at a younger age if you are overweight or have a high risk for diabetes. What should I know about preventing infection? Hepatitis B If you have a higher risk for hepatitis B, you should be screened for this virus. Talk with your health care provider to find out if you are at risk for hepatitis B infection. Hepatitis C Blood testing is recommended for:  Everyone born from [birth year] through  1965.  Anyone with known risk factors for hepatitis C. Sexually transmitted infections (STIs)  You should be screened each year for STIs, including gonorrhea and chlamydia, if: ? You are sexually active and are younger than 79 years of age. ? You are older than 79 years of age and your health care provider tells you that you are at risk for this type of infection. ? Your sexual activity has changed since you were last screened, and you are at increased risk for chlamydia or gonorrhea. Ask your health care provider if you are at risk.  Ask your health care provider about whether you are at high risk for HIV. Your health care provider may recommend a prescription medicine to help prevent HIV infection. If you choose to take medicine to prevent HIV, you should first get tested for HIV. You should then be tested every 3 months for as long as you are taking the medicine. Follow these instructions at home: Lifestyle  Do not use any products that contain nicotine or tobacco, such as cigarettes, e-cigarettes, and chewing tobacco. If you need help quitting, ask your health care provider.  Do not use street drugs.  Do not share needles.  Ask your health care provider for help if you need support or information about quitting drugs. Alcohol use  Do not drink alcohol if your health care provider tells you not to drink.  If you drink alcohol: ? Limit how much you have to 0-2 drinks a day. ? Be aware of how much alcohol is in your drink. In the [place] one drink equals one 12 oz bottle of beer (355 mL), one 5 oz glass of wine (148 mL), or one 1 oz glass of hard liquor (44 mL). General instructions  Schedule regular health, dental, and eye exams.  Stay current with your vaccines.  Tell your health care provider if: ? You often feel depressed. ? You have ever been abused or do not feel safe at home. Summary  Adopting a healthy lifestyle and getting preventive care are important in promoting  health and wellness.  Follow your health care provider's instructions about healthy diet, exercising, and getting tested or screened for diseases.  Follow your health care provider's instructions on monitoring your cholesterol and blood pressure. This information is not intended to replace advice given to you by your health care provider. Make sure you discuss any questions you have with your health care provider. Document Revised: 04/15/2018 Document Reviewed: 04/15/2018 Elsevier Patient Education  2020 [employer].

## 2020-01-25 NOTE — Progress Notes (Signed)
Subjective:    Patient ID: Ronald Ross, male    DOB: 1941-01-23, 79 y.o.   MRN: [id number]  HPI He is here for a physical exam.     Medications and allergies reviewed with patient and updated if appropriate.  Patient Active Problem List   Diagnosis Date Noted  . Fuchs' corneal dystrophy 01/23/2018  . Prediabetes 01/23/2018  . Polyp of colon, hyperplastic 01/17/2014  . METHICILLIN RESISTANT STAPHYLOCOCCUS AUREUS INFECTION 06/24/2006  . LIPOMAS, MULTIPLE 06/24/2006  . Hyperlipidemia 06/24/2006  . Essential hypertension 06/24/2006  . PROSTATE CANCER, HX OF 06/24/2006    Current Outpatient Medications on File Prior to Visit  Medication Sig Dispense Refill  . amLODipine (NORVASC) 5 MG tablet Take 1 tablet (5 mg total) by mouth daily. Annual appt due in Sept must see provider for future refills 30 tablet 0  . aspirin 81 MG tablet Take 81 mg by mouth daily.      Marland Kitchen atorvastatin (LIPITOR) 20 MG tablet Take 1 tablet (20 mg total) by mouth daily. Annual appt due in Sept must see provider for future refills 30 tablet 0  . Ferrous Sulfate (IRON) 325 (65 FE) MG TABS Take by mouth daily.    Marland Kitchen losartan (COZAAR) 25 MG tablet Take 1 tablet (25 mg total) by mouth daily. Annual appt due in Sept must see provider for future refills 30 tablet 0  . Magnesium 250 MG TABS     . Multiple Vitamin (MULTIVITAMIN) tablet Take 1 tablet by mouth daily.      . Omega-3 Fatty Acids (FISH OIL) 1000 MG CAPS Take by mouth 2 (two) times daily.       No current facility-administered medications on file prior to visit.    Past Medical History:  Diagnosis Date  . Anemia    blood donor  . Fuchs' corneal dystrophy    Dr. Patrice Paradise; [facility] , [place]  . Hyperlipidemia   . Hypertension   . Personal history of prostate cancer    Dr. Serita Butcher    Past Surgical History:  Procedure Laterality Date  . COLONOSCOPY  2009   Dr Fuller Plan, virtual colonoscopy recommended  in 2013 because of tortuous colon  .  colonoscopy with polypectomy  2003  . [place] 2010   cataract & implant bilaterally  . PROSTATE SURGERY  1998   prostatectomy    Social History   Socioeconomic History  . Marital status: Widowed    Spouse name: Not on file  . Number of children: 1  . Years of education: Not on file  . Highest education level: Not on file  Occupational History  . Occupation: Retired  Tobacco Use  . Smoking status: Never Smoker  . Smokeless tobacco: Never Used  Vaping Use  . Vaping Use: Never assessed  Substance and Sexual Activity  . Alcohol use: Yes    Alcohol/week: 0.0 standard drinks    Comment:  seldom  . Drug use: No  . Sexual activity: Not on file  Other Topics Concern  . Not on file  Social History Narrative  . Not on file   Social Determinants of Health   Financial Resource Strain:   . Difficulty of Paying Living Expenses: Not on file  Food Insecurity:   . Worried About Charity fundraiser in the Last Year: Not on file  . Ran Out of Food in the Last Year: Not on file  Transportation Needs:   . Lack of Transportation (  Medical): Not on file  . Lack of Transportation (Non-Medical): Not on file  Physical Activity:   . Days of Exercise per Week: Not on file  . Minutes of Exercise per Session: Not on file  Stress:   . Feeling of Stress : Not on file  Social Connections:   . Frequency of Communication with Friends and Family: Not on file  . Frequency of Social Gatherings with Friends and Family: Not on file  . Attends Religious Services: Not on file  . Active Member of Clubs or Organizations: Not on file  . Attends Archivist Meetings: Not on file  . Marital Status: Not on file    Family History  Problem Relation Age of Onset  . Hypertension Mother   . Heart attack Mother        in 65s  . Atrial fibrillation Mother        Aortic Stenosis  . Anemia Mother        ? slow GI bleed  . Lung cancer Father        smoker  . Breast cancer Sister   .  Dementia Paternal Grandmother   . Stroke Neg Hx   . Diabetes Neg Hx     Review of Systems  Constitutional: Negative for appetite change, chills, fatigue and fever.  Eyes: Negative for visual disturbance.  Respiratory: Negative for cough, shortness of breath and wheezing.   Cardiovascular: Negative for chest pain, palpitations and leg swelling.  Gastrointestinal: Negative for abdominal pain, blood in stool, constipation, diarrhea and nausea.       No gerd  Genitourinary: Negative for difficulty urinating, dysuria and hematuria.  Musculoskeletal: Positive for back pain (if stands too long). Negative for arthralgias.  Skin: Negative for color change and rash.  Neurological: Negative for dizziness, light-headedness, numbness and headaches.  Hematological: Bruises/bleeds easily (bruises).  Psychiatric/Behavioral: Negative for dysphoric mood and sleep disturbance. The patient is not nervous/anxious.        Objective:   Vitals:   01/26/20 0804  BP: 130/72  Pulse: 67  Temp: 98.4 F (36.9 C)  SpO2: 97%   Filed Weights   01/26/20 0804  Weight: 175 lb (79.4 kg)   Body mass index is 27.61 kg/m.  BP Readings from Last 3 Encounters:  01/26/20 130/72  01/25/19 (!) 156/82  01/23/18 132/74    Wt Readings from Last 3 Encounters:  01/26/20 175 lb (79.4 kg)  01/25/19 182 lb (82.6 kg)  09/23/18 180 lb (81.6 kg)     Physical Exam Constitutional: He appears well-developed and well-nourished. No distress.  HENT:  Head: Normocephalic and atraumatic.  Right Ear: External ear normal.  Left Ear: External ear normal.  Mouth/Throat: Oropharynx is clear and moist.  Normal ear canals and TM b/l  Eyes: Conjunctivae and EOM are normal.  Neck: Neck supple. No tracheal deviation present. No thyromegaly present.  No carotid bruit  Cardiovascular: Normal rate, regular rhythm, normal heart sounds and intact distal pulses.   No murmur heard. Pulmonary/Chest: Effort normal and breath sounds  normal. No respiratory distress. He has no wheezes. He has no rales.  Abdominal: Soft. He exhibits no distension. There is no tenderness.  Genitourinary: deferred  Musculoskeletal: He exhibits no edema.  Lymphadenopathy:   He has no cervical adenopathy.  Skin: Skin is warm and dry. He is not diaphoretic.  Psychiatric: He has a normal mood and affect. His behavior is normal.         Assessment & Plan:  Physical exam: Screening blood work  ordered Immunizations  Flu vaccine today,  Had covid, discussed td, shingrix Colonoscopy   N/a due age Eye exams   Up to date  Exercise   Walks regularly, some stretching Weight  Good for age Substance abuse   none    See Problem List for Assessment and Plan of chronic medical problems.   This visit occurred during the SARS-CoV-2 public health emergency.  Safety protocols were in place, including screening questions prior to the visit, additional usage of staff PPE, and extensive cleaning of exam room while observing appropriate contact time as indicated for disinfecting solutions.

## 2020-01-26 ENCOUNTER — Encounter: Payer: Self-pay | Admitting: Internal Medicine

## 2020-01-26 ENCOUNTER — Other Ambulatory Visit: Payer: Self-pay

## 2020-01-26 ENCOUNTER — Ambulatory Visit (INDEPENDENT_AMBULATORY_CARE_PROVIDER_SITE_OTHER): Payer: Medicare HMO | Admitting: Internal Medicine

## 2020-01-26 VITALS — BP 130/72 | HR 67 | Temp 98.4°F | Wt 175.0 lb

## 2020-01-26 DIAGNOSIS — I10 Essential (primary) hypertension: Secondary | ICD-10-CM | POA: Diagnosis not present

## 2020-01-26 DIAGNOSIS — Z8546 Personal history of malignant neoplasm of prostate: Secondary | ICD-10-CM

## 2020-01-26 DIAGNOSIS — Z1159 Encounter for screening for other viral diseases: Secondary | ICD-10-CM

## 2020-01-26 DIAGNOSIS — R7303 Prediabetes: Secondary | ICD-10-CM

## 2020-01-26 DIAGNOSIS — Z23 Encounter for immunization: Secondary | ICD-10-CM | POA: Diagnosis not present

## 2020-01-26 DIAGNOSIS — Z0000 Encounter for general adult medical examination without abnormal findings: Secondary | ICD-10-CM | POA: Diagnosis not present

## 2020-01-26 DIAGNOSIS — E7849 Other hyperlipidemia: Secondary | ICD-10-CM | POA: Diagnosis not present

## 2020-01-26 MED ORDER — AMLODIPINE BESYLATE 5 MG PO TABS
5.0000 mg | ORAL_TABLET | Freq: Every day | ORAL | 3 refills | Status: DC
Start: 2020-01-26 — End: 2021-01-31

## 2020-01-26 MED ORDER — ATORVASTATIN CALCIUM 20 MG PO TABS
20.0000 mg | ORAL_TABLET | Freq: Every day | ORAL | 3 refills | Status: DC
Start: 2020-01-26 — End: 2021-01-31

## 2020-01-26 MED ORDER — LOSARTAN POTASSIUM 25 MG PO TABS
25.0000 mg | ORAL_TABLET | Freq: Every day | ORAL | 3 refills | Status: DC
Start: 2020-01-26 — End: 2021-01-31

## 2020-01-26 NOTE — Assessment & Plan Note (Signed)
Chronic Check lipid panel, cmp, tsh Continue daily statin Regular exercise and healthy diet encouraged

## 2020-01-26 NOTE — Assessment & Plan Note (Signed)
Chronic BP well controlled Current regimen effective and well tolerated Continue current medications at current doses cmp  

## 2020-01-26 NOTE — Addendum Note (Signed)
Addended by: Cresenciano Lick on: 01/26/2020 08:42 AM   Modules accepted: Orders

## 2020-01-26 NOTE — Assessment & Plan Note (Signed)
Chronic Check a1c Low sugar / carb diet Stressed regular exercise  

## 2020-01-26 NOTE — Assessment & Plan Note (Signed)
Ho prostate ca w/o evidence of recurrence psa

## 2020-01-27 LAB — CBC WITH DIFFERENTIAL/PLATELET
Absolute Monocytes: 500 cells/uL (ref 200–950)
Basophils Absolute: 49 cells/uL (ref 0–200)
Basophils Relative: 0.8 %
Eosinophils Absolute: 159 cells/uL (ref 15–500)
Eosinophils Relative: 2.6 %
HCT: 44.9 % (ref 38.5–50.0)
Hemoglobin: 15.1 g/dL (ref 13.2–17.1)
Lymphs Abs: 2074 cells/uL (ref 850–3900)
MCH: 31.5 pg (ref 27.0–33.0)
MCHC: 33.6 g/dL (ref 32.0–36.0)
MCV: 93.7 fL (ref 80.0–100.0)
MPV: 9.4 fL (ref 7.5–12.5)
Monocytes Relative: 8.2 %
Neutro Abs: 3318 cells/uL (ref 1500–7800)
Neutrophils Relative %: 54.4 %
Platelets: 189 10*3/uL (ref 140–400)
RBC: 4.79 10*6/uL (ref 4.20–5.80)
RDW: 12.1 % (ref 11.0–15.0)
Total Lymphocyte: 34 %
WBC: 6.1 10*3/uL (ref 3.8–10.8)

## 2020-01-27 LAB — LIPID PANEL
Cholesterol: 155 mg/dL (ref <200)
HDL: 51 mg/dL (ref >=40)
LDL Cholesterol (Calc): 90 mg/dL (calc)
Non-HDL Cholesterol (Calc): 104 mg/dL (calc) (ref <130)
Total CHOL/HDL Ratio: 3 (calc) (ref <5.0)
Triglycerides: 55 mg/dL (ref <150)

## 2020-01-27 LAB — COMPREHENSIVE METABOLIC PANEL
AG Ratio: 1.7 (calc) (ref 1.0–2.5)
ALT: 21 U/L (ref 9–46)
AST: 22 U/L (ref 10–35)
Albumin: 4.1 g/dL (ref 3.6–5.1)
Alkaline phosphatase (APISO): 65 U/L (ref 35–144)
BUN: 18 mg/dL (ref 7–25)
CO2: 26 mmol/L (ref 20–32)
Calcium: 9.5 mg/dL (ref 8.6–10.3)
Chloride: 106 mmol/L (ref 98–110)
Creat: 1.09 mg/dL (ref 0.70–1.18)
Globulin: 2.4 g/dL (calc) (ref 1.9–3.7)
Glucose, Bld: 89 mg/dL (ref 65–99)
Potassium: 4.4 mmol/L (ref 3.5–5.3)
Sodium: 142 mmol/L (ref 135–146)
Total Bilirubin: 0.5 mg/dL (ref 0.2–1.2)
Total Protein: 6.5 g/dL (ref 6.1–8.1)

## 2020-01-27 LAB — TSH: TSH: 4.06 mIU/L (ref 0.40–4.50)

## 2020-01-27 LAB — HEPATITIS C ANTIBODY
Hepatitis C Ab: NONREACTIVE
SIGNAL TO CUT-OFF: 0.01 (ref <1.00)

## 2020-01-27 LAB — HEMOGLOBIN A1C
Hgb A1c MFr Bld: 5.6 % of total Hgb (ref <5.7)
Mean Plasma Glucose: 114 (calc)
eAG (mmol/L): 6.3 (calc)

## 2020-01-27 LAB — PSA: PSA: <0.04 ng/mL (ref <=4.0)

## 2020-03-19 ENCOUNTER — Encounter: Payer: Self-pay | Admitting: Internal Medicine

## 2020-04-17 DIAGNOSIS — H35373 Puckering of macula, bilateral: Secondary | ICD-10-CM | POA: Diagnosis not present

## 2020-04-17 DIAGNOSIS — H02831 Dermatochalasis of right upper eyelid: Secondary | ICD-10-CM | POA: Diagnosis not present

## 2020-04-17 DIAGNOSIS — H02834 Dermatochalasis of left upper eyelid: Secondary | ICD-10-CM | POA: Diagnosis not present

## 2020-04-17 DIAGNOSIS — Z961 Presence of intraocular lens: Secondary | ICD-10-CM | POA: Diagnosis not present

## 2020-04-17 DIAGNOSIS — H18513 Endothelial corneal dystrophy, bilateral: Secondary | ICD-10-CM | POA: Diagnosis not present

## 2020-04-17 DIAGNOSIS — H04123 Dry eye syndrome of bilateral lacrimal glands: Secondary | ICD-10-CM | POA: Diagnosis not present

## 2020-07-28 ENCOUNTER — Encounter: Payer: Self-pay | Admitting: Internal Medicine

## 2021-01-23 ENCOUNTER — Encounter: Payer: Self-pay | Admitting: Internal Medicine

## 2021-01-25 NOTE — Patient Instructions (Addendum)
Blood work was ordered.     Flu immunization administered today.     Medications changes include :   None   Please followup in 1 year    Health Maintenance, Male Adopting a healthy lifestyle and getting preventive care are important in promoting health and wellness. Ask your health care provider about: The right schedule for you to have regular tests and exams. Things you can do on your own to prevent diseases and keep yourself healthy. What should I know about diet, weight, and exercise? Eat a healthy diet  Eat a diet that includes plenty of vegetables, fruits, low-fat dairy products, and lean protein. Do not eat a lot of foods that are high in solid fats, added sugars, or sodium. Maintain a healthy weight Body mass index (BMI) is a measurement that can be used to identify possible weight problems. It estimates body fat based on height and weight. Your health care provider can help determine your BMI and help you achieve or maintain a healthy weight. Get regular exercise Get regular exercise. This is one of the most important things you can do for your health. Most adults should: Exercise for at least 150 minutes each week. The exercise should increase your heart rate and make you sweat (moderate-intensity exercise). Do strengthening exercises at least twice a week. This is in addition to the moderate-intensity exercise. Spend less time sitting. Even light physical activity can be beneficial. Watch cholesterol and blood lipids Have your blood tested for lipids and cholesterol at 80 years of age, then have this test every 5 years. You may need to have your cholesterol levels checked more often if: Your lipid or cholesterol levels are high. You are older than 80 years of age. You are at high risk for heart disease. What should I know about cancer screening? Many types of cancers can be detected early and may often be prevented. Depending on your health history and family history,  you may need to have cancer screening at various ages. This may include screening for: Colorectal cancer. Prostate cancer. Skin cancer. Lung cancer. What should I know about heart disease, diabetes, and high blood pressure? Blood pressure and heart disease High blood pressure causes heart disease and increases the risk of stroke. This is more likely to develop in people who have high blood pressure readings, are of African descent, or are overweight. Talk with your health care provider about your target blood pressure readings. Have your blood pressure checked: Every 3-5 years if you are 65-58 years of age. Every year if you are 57 years old or older. If you are between the ages of 8 and 16 and are a current or former smoker, ask your health care provider if you should have a one-time screening for abdominal aortic aneurysm (AAA). Diabetes Have regular diabetes screenings. This checks your fasting blood sugar level. Have the screening done: Once every three years after age 42 if you are at a normal weight and have a low risk for diabetes. More often and at a younger age if you are overweight or have a high risk for diabetes. What should I know about preventing infection? Hepatitis B If you have a higher risk for hepatitis B, you should be screened for this virus. Talk with your health care provider to find out if you are at risk for hepatitis B infection. Hepatitis C Blood testing is recommended for: Everyone born from [birth year] through 1965. Anyone with known risk factors for hepatitis C. Sexually transmitted  infections (STIs) You should be screened each year for STIs, including gonorrhea and chlamydia, if: You are sexually active and are younger than 80 years of age. You are older than 80 years of age and your health care provider tells you that you are at risk for this type of infection. Your sexual activity has changed since you were last screened, and you are at increased risk for  chlamydia or gonorrhea. Ask your health care provider if you are at risk. Ask your health care provider about whether you are at high risk for HIV. Your health care provider may recommend a prescription medicine to help prevent HIV infection. If you choose to take medicine to prevent HIV, you should first get tested for HIV. You should then be tested every 3 months for as long as you are taking the medicine. Follow these instructions at home: Lifestyle Do not use any products that contain nicotine or tobacco, such as cigarettes, e-cigarettes, and chewing tobacco. If you need help quitting, ask your health care provider. Do not use street drugs. Do not share needles. Ask your health care provider for help if you need support or information about quitting drugs. Alcohol use Do not drink alcohol if your health care provider tells you not to drink. If you drink alcohol: Limit how much you have to 0-2 drinks a day. Be aware of how much alcohol is in your drink. In the [place] one drink equals one 12 oz bottle of beer (355 mL), one 5 oz glass of wine (148 mL), or one 1 oz glass of hard liquor (44 mL). General instructions Schedule regular health, dental, and eye exams. Stay current with your vaccines. Tell your health care provider if: You often feel depressed. You have ever been abused or do not feel safe at home. Summary Adopting a healthy lifestyle and getting preventive care are important in promoting health and wellness. Follow your health care provider's instructions about healthy diet, exercising, and getting tested or screened for diseases. Follow your health care provider's instructions on monitoring your cholesterol and blood pressure. This information is not intended to replace advice given to you by your health care provider. Make sure you discuss any questions you have with your health care provider. Document Revised: 06/30/2020 Document Reviewed: 04/15/2018 Elsevier Patient Education   2022 [employer].

## 2021-01-25 NOTE — Progress Notes (Signed)
Subjective:    Patient ID: Ronald Ross, male    DOB: 1941/04/19, 80 y.o.   MRN: [id number]   This visit occurred during the SARS-CoV-2 public health emergency.  Safety protocols were in place, including screening questions prior to the visit, additional usage of staff PPE, and extensive cleaning of exam room while observing appropriate contact time as indicated for disinfecting solutions.   HPI He is here for a physical exam.     Medications and allergies reviewed with patient and updated if appropriate.  Patient Active Problem List   Diagnosis Date Noted   Fuchs' corneal dystrophy 01/23/2018   Prediabetes 01/23/2018   Polyp of colon, hyperplastic 01/17/2014   Personal history of MRSA (methicillin resistant Staphylococcus aureus) 06/24/2006   LIPOMAS, MULTIPLE 06/24/2006   Hyperlipidemia 06/24/2006   Essential hypertension 06/24/2006   PROSTATE CANCER, HX OF 06/24/2006    Current Outpatient Medications on File Prior to Visit  Medication Sig Dispense Refill   amLODipine (NORVASC) 5 MG tablet Take 1 tablet (5 mg total) by mouth daily. 90 tablet 3   aspirin 81 MG tablet Take 81 mg by mouth daily.       atorvastatin (LIPITOR) 20 MG tablet Take 1 tablet (20 mg total) by mouth daily. 90 tablet 3   Ferrous Sulfate (IRON) 325 (65 FE) MG TABS Take by mouth daily.     losartan (COZAAR) 25 MG tablet Take 1 tablet (25 mg total) by mouth daily. 90 tablet 3   Magnesium 250 MG TABS      Multiple Vitamin (MULTIVITAMIN) tablet Take 1 tablet by mouth daily.       Omega-3 Fatty Acids (FISH OIL) 1000 MG CAPS Take by mouth 2 (two) times daily.       No current facility-administered medications on file prior to visit.    Past Medical History:  Diagnosis Date   Anemia    blood donor   Fuchs' corneal dystrophy    Dr. Patrice Paradise; [facility] , [place]   Hyperlipidemia    Hypertension    Personal history of prostate cancer    Dr. Serita Butcher    Past Surgical History:  Procedure  Laterality Date   COLONOSCOPY  2009   Dr Fuller Plan, virtual colonoscopy recommended  in 2013 because of tortuous colon   colonoscopy with polypectomy  2003   [place] [facility] 2010   cataract & implant bilaterally   [place]   prostatectomy    Social History   Socioeconomic History   Marital status: Widowed    Spouse name: Not on file   Number of children: 1   Years of education: Not on file   Highest education level: Not on file  Occupational History   Occupation: Retired  Tobacco Use   Smoking status: Never   Smokeless tobacco: Never  Vaping Use   Vaping Use: Not on file  Substance and Sexual Activity   Alcohol use: Yes    Alcohol/week: 0.0 standard drinks    Comment:  seldom   Drug use: No   Sexual activity: Not on file  Other Topics Concern   Not on file  Social History Narrative   Not on file   Social Determinants of Health   Financial Resource Strain: Not on file  Food Insecurity: Not on file  Transportation Needs: Not on file  Physical Activity: Not on file  Stress: Not on file  Social Connections: Not on file    Family History  Problem Relation Age of Onset   Hypertension Mother    Heart attack Mother        in 49s   Atrial fibrillation Mother        Aortic Stenosis   Anemia Mother        ? slow GI bleed   Lung cancer Father        smoker   Breast cancer Sister    Dementia Paternal Grandmother    Stroke Neg Hx    Diabetes Neg Hx     Review of Systems  Constitutional:  Negative for chills and fever.  Eyes:  Negative for visual disturbance.  Respiratory:  Negative for cough, shortness of breath and wheezing.   Cardiovascular:  Negative for chest pain, palpitations and leg swelling.  Gastrointestinal:  Negative for abdominal pain, blood in stool, constipation, diarrhea and nausea.       No gerd  Genitourinary:  Negative for difficulty urinating, dysuria and hematuria.  Musculoskeletal:  Positive for arthralgias (mild - knees,  hands). Negative for back pain.  Skin:  Negative for color change and rash.  Neurological:  Negative for dizziness, light-headedness and headaches.  Psychiatric/Behavioral:  Negative for dysphoric mood. The patient is not nervous/anxious.       Objective:   Vitals:   01/26/21 0807  BP: 118/70  Pulse: (!) 51  Temp: 98 F (36.7 C)  SpO2: 99%   Filed Weights   01/26/21 0807  Weight: 161 lb (73 kg)   Body mass index is 23.1 kg/m.  BP Readings from Last 3 Encounters:  01/26/21 118/70  01/26/20 130/72  01/25/19 (!) 156/82    Wt Readings from Last 3 Encounters:  01/26/21 161 lb (73 kg)  01/26/20 175 lb (79.4 kg)  01/25/19 182 lb (82.6 kg)    Depression screen [facility] 2/9 01/26/2021 01/26/2020 01/25/2019 01/23/2018 01/22/2017  Decreased Interest 0 0 0 0 0  Down, Depressed, Hopeless 0 0 0 0 0  PHQ - 2 Score 0 0 0 0 0  Altered sleeping 0 - - 1 -  Tired, decreased energy 0 - - 0 -  Change in appetite 0 - - 0 -  Feeling bad or failure about yourself  0 - - 0 -  Trouble concentrating 0 - - 0 -  Moving slowly or fidgety/restless 0 - - 0 -  Suicidal thoughts 0 - - 0 -  PHQ-9 Score 0 - - 1 -    GAD 7 : Generalized Anxiety Score 01/26/2021 01/23/2018  Nervous, Anxious, on Edge 0 0  Control/stop worrying 0 0  Worry too much - different things 0 0  Trouble relaxing 0 0  Restless 0 0  Easily annoyed or irritable 0 0  Afraid - awful might happen 0 0  Total GAD 7 Score 0 0       Physical Exam Constitutional: He appears well-developed and well-nourished. No distress.  HENT:  Head: Normocephalic and atraumatic.  Right Ear: External ear normal.  Left Ear: External ear normal.  Mouth/Throat: Oropharynx is clear and moist.  Normal ear canals and TM b/l  Eyes: Conjunctivae and EOM are normal.  Neck: Neck supple. No tracheal deviation present. No thyromegaly present.  No carotid bruit  Cardiovascular: Normal rate, regular rhythm, normal heart sounds and intact distal pulses.   No  murmur heard. Pulmonary/Chest: Effort normal and breath sounds normal. No respiratory distress. He has no wheezes. He has no rales.  Abdominal: Soft. He exhibits no distension. There is no  tenderness.  Genitourinary: deferred  Musculoskeletal: He exhibits no edema.  Lymphadenopathy:   He has no cervical adenopathy.  Skin: Skin is warm and dry. He is not diaphoretic.  Psychiatric: He has a normal mood and affect. His behavior is normal.         Assessment & Plan:   Physical exam: Screening blood work  ordered Exercise   regular Weight  normal Substance abuse   none   Screened for depression using the PHQ 9 scale.  No evidence of depression.   Screened for anxiety using GAD7 Scale.  No evidence of anxiety.   Reviewed recommended immunizations.   Health Maintenance  Topic Date Due   TETANUS/TDAP  10/20/2019   COVID-19 Vaccine (4 - Booster for Moderna series) 07/03/2020   INFLUENZA VACCINE  12/04/2020   Zoster Vaccines- Shingrix  Completed   HPV VACCINES  Aged Out     See Problem List for Assessment and Plan of chronic medical problems.

## 2021-01-26 ENCOUNTER — Other Ambulatory Visit: Payer: Self-pay

## 2021-01-26 ENCOUNTER — Ambulatory Visit (INDEPENDENT_AMBULATORY_CARE_PROVIDER_SITE_OTHER): Payer: Medicare HMO | Admitting: Internal Medicine

## 2021-01-26 ENCOUNTER — Encounter: Payer: Self-pay | Admitting: Internal Medicine

## 2021-01-26 VITALS — BP 118/70 | HR 51 | Temp 98.0°F | Ht 70.0 in | Wt 161.0 lb

## 2021-01-26 DIAGNOSIS — E7849 Other hyperlipidemia: Secondary | ICD-10-CM

## 2021-01-26 DIAGNOSIS — Z0000 Encounter for general adult medical examination without abnormal findings: Secondary | ICD-10-CM

## 2021-01-26 DIAGNOSIS — R7303 Prediabetes: Secondary | ICD-10-CM

## 2021-01-26 DIAGNOSIS — Z23 Encounter for immunization: Secondary | ICD-10-CM

## 2021-01-26 DIAGNOSIS — Z1331 Encounter for screening for depression: Secondary | ICD-10-CM

## 2021-01-26 DIAGNOSIS — Z8546 Personal history of malignant neoplasm of prostate: Secondary | ICD-10-CM

## 2021-01-26 DIAGNOSIS — I10 Essential (primary) hypertension: Secondary | ICD-10-CM

## 2021-01-26 LAB — COMPREHENSIVE METABOLIC PANEL
ALT: 17 U/L (ref 0–53)
AST: 18 U/L (ref 0–37)
Albumin: 3.9 g/dL (ref 3.5–5.2)
Alkaline Phosphatase: 66 U/L (ref 39–117)
BUN: 20 mg/dL (ref 6–23)
CO2: 30 mEq/L (ref 19–32)
Calcium: 9.3 mg/dL (ref 8.4–10.5)
Chloride: 106 mEq/L (ref 96–112)
Creatinine, Ser: 1.16 mg/dL (ref 0.40–1.50)
GFR: 59.52 mL/min — ABNORMAL LOW (ref >60.00)
Glucose, Bld: 95 mg/dL (ref 70–99)
Potassium: 4 mEq/L (ref 3.5–5.1)
Sodium: 143 mEq/L (ref 135–145)
Total Bilirubin: 0.4 mg/dL (ref 0.2–1.2)
Total Protein: 6.4 g/dL (ref 6.0–8.3)

## 2021-01-26 LAB — CBC WITH DIFFERENTIAL/PLATELET
Basophils Absolute: 0 10*3/uL (ref 0.0–0.1)
Basophils Relative: 0.8 % (ref 0.0–3.0)
Eosinophils Absolute: 0.2 10*3/uL (ref 0.0–0.7)
Eosinophils Relative: 2.9 % (ref 0.0–5.0)
HCT: 41.7 % (ref 39.0–52.0)
Hemoglobin: 14.2 g/dL (ref 13.0–17.0)
Lymphocytes Relative: 35.3 % (ref 12.0–46.0)
Lymphs Abs: 1.8 10*3/uL (ref 0.7–4.0)
MCHC: 34.1 g/dL (ref 30.0–36.0)
MCV: 93.4 fl (ref 78.0–100.0)
Monocytes Absolute: 0.4 10*3/uL (ref 0.1–1.0)
Monocytes Relative: 7 % (ref 3.0–12.0)
Neutro Abs: 2.8 10*3/uL (ref 1.4–7.7)
Neutrophils Relative %: 54 % (ref 43.0–77.0)
Platelets: 168 10*3/uL (ref 150.0–400.0)
RBC: 4.46 Mil/uL (ref 4.22–5.81)
RDW: 12.9 % (ref 11.5–15.5)
WBC: 5.2 10*3/uL (ref 4.0–10.5)

## 2021-01-26 LAB — LIPID PANEL
Cholesterol: 144 mg/dL (ref 0–200)
HDL: 49.6 mg/dL (ref >39.00)
LDL Cholesterol: 85 mg/dL (ref 0–99)
NonHDL: 94.84
Total CHOL/HDL Ratio: 3
Triglycerides: 47 mg/dL (ref 0.0–149.0)
VLDL: 9.4 mg/dL (ref 0.0–40.0)

## 2021-01-26 LAB — HEMOGLOBIN A1C: Hgb A1c MFr Bld: 5.8 % (ref 4.6–6.5)

## 2021-01-26 LAB — TSH: TSH: 3.62 u[IU]/mL (ref 0.35–5.50)

## 2021-01-26 NOTE — Addendum Note (Signed)
Addended by: Marcina Millard on: 01/26/2021 09:28 AM   Modules accepted: Orders

## 2021-01-26 NOTE — Assessment & Plan Note (Signed)
Chronic Check a1c Low sugar / carb diet Stressed regular exercise  

## 2021-01-26 NOTE — Addendum Note (Signed)
Addended by: Boris Lown B on: 01/26/2021 08:40 AM   Modules accepted: Orders

## 2021-01-26 NOTE — Assessment & Plan Note (Signed)
History of prostate cancer without evidence of recurrence We will check PSA

## 2021-01-26 NOTE — Assessment & Plan Note (Signed)
Chronic Check lipid panel, CMP Continue atorvastatin 20 mg daily Regular exercise and healthy diet encouraged

## 2021-01-26 NOTE — Assessment & Plan Note (Signed)
Chronic Blood pressure well controlled Continue amlodipine 5 mg daily, losartan 25 mg daily CMP

## 2021-01-31 ENCOUNTER — Other Ambulatory Visit: Payer: Self-pay

## 2021-01-31 ENCOUNTER — Encounter: Payer: Self-pay | Admitting: Internal Medicine

## 2021-01-31 DIAGNOSIS — E7849 Other hyperlipidemia: Secondary | ICD-10-CM

## 2021-01-31 DIAGNOSIS — I10 Essential (primary) hypertension: Secondary | ICD-10-CM

## 2021-01-31 MED ORDER — LOSARTAN POTASSIUM 25 MG PO TABS: 25.0000 mg | ORAL_TABLET | Freq: Every day | ORAL | 3 refills | Status: DC

## 2021-01-31 MED ORDER — ATORVASTATIN CALCIUM 20 MG PO TABS: 20.0000 mg | ORAL_TABLET | Freq: Every day | ORAL | 3 refills | Status: DC

## 2021-01-31 MED ORDER — AMLODIPINE BESYLATE 5 MG PO TABS: 5.0000 mg | ORAL_TABLET | Freq: Every day | ORAL | 3 refills | Status: DC

## 2021-04-09 DIAGNOSIS — Z961 Presence of intraocular lens: Secondary | ICD-10-CM | POA: Diagnosis not present

## 2021-04-09 DIAGNOSIS — H04123 Dry eye syndrome of bilateral lacrimal glands: Secondary | ICD-10-CM | POA: Diagnosis not present

## 2021-04-09 DIAGNOSIS — H43813 Vitreous degeneration, bilateral: Secondary | ICD-10-CM | POA: Diagnosis not present

## 2021-04-09 DIAGNOSIS — H47323 Drusen of optic disc, bilateral: Secondary | ICD-10-CM | POA: Diagnosis not present

## 2021-04-09 DIAGNOSIS — H35373 Puckering of macula, bilateral: Secondary | ICD-10-CM | POA: Diagnosis not present

## 2021-04-09 DIAGNOSIS — H02831 Dermatochalasis of right upper eyelid: Secondary | ICD-10-CM | POA: Diagnosis not present

## 2021-04-09 DIAGNOSIS — H353131 Nonexudative age-related macular degeneration, bilateral, early dry stage: Secondary | ICD-10-CM | POA: Diagnosis not present

## 2021-04-09 DIAGNOSIS — H18513 Endothelial corneal dystrophy, bilateral: Secondary | ICD-10-CM | POA: Diagnosis not present

## 2021-04-09 DIAGNOSIS — H02834 Dermatochalasis of left upper eyelid: Secondary | ICD-10-CM | POA: Diagnosis not present

## 2021-08-22 ENCOUNTER — Encounter: Payer: Self-pay | Admitting: Internal Medicine

## 2022-01-09 ENCOUNTER — Ambulatory Visit: Payer: Medicare HMO

## 2022-01-09 ENCOUNTER — Telehealth: Payer: Self-pay

## 2022-01-09 NOTE — Telephone Encounter (Signed)
Called patient several times lvm to return call, Patient may reschedule for the next available appointment.  Advised that AWV can be scheduled with CPE with Burns on 01/28/2022.  S. Chyane Greer,LPN

## 2022-01-16 ENCOUNTER — Telehealth: Payer: Self-pay

## 2022-01-16 ENCOUNTER — Ambulatory Visit (INDEPENDENT_AMBULATORY_CARE_PROVIDER_SITE_OTHER): Payer: Medicare HMO

## 2022-01-16 VITALS — Ht 70.0 in | Wt 161.0 lb

## 2022-01-16 DIAGNOSIS — Z0000 Encounter for general adult medical examination without abnormal findings: Secondary | ICD-10-CM

## 2022-01-16 NOTE — Progress Notes (Signed)
Subjective:   Ronald Ross is a 81 y.o. male who presents for Medicare Annual/Subsequent preventive examination.  Review of Systems    Virtual Visit via Telephone Note  I connected with  Ronald Ross on 01/16/22 at 10:15 AM EDT by telephone and verified that I am speaking with the correct person using two identifiers.  Location: Patient: Home  Provider: Helena Persons participating in the virtual visit: [place]   I discussed the limitations, risks, security and privacy concerns of performing an evaluation and management service by telephone and the availability of in person appointments. The patient expressed understanding and agreed to proceed.  Interactive audio and video telecommunications were attempted between this nurse and patient, however failed, due to patient having technical difficulties OR patient did not have access to video capability.  We continued and completed visit with audio only.  Some vital signs may be absent or patient reported.   Sheral Flow, LPN  Cardiac Risk Factors include: advanced age (>20mn, >>61women);hypertension;dyslipidemia;male gender;family history of premature cardiovascular disease     Objective:    Today's Vitals   01/16/22 1030  Weight: 161 lb (73 kg)  Height: '5\' 10"'$  (1.778 m)   Body mass index is 23.1 kg/m.     01/16/2022   10:23 AM 01/18/2015    4:36 PM  Advanced Directives  Does Patient Have a Medical Advance Directive? Yes Yes  Type of [place]aramedicof [place] will   Copy of HEufaulain Chart? No - copy requested Yes    Current Medications (verified) Outpatient Encounter Medications as of 01/16/2022  Medication Sig   amLODipine (NORVASC) 5 MG tablet Take 1 tablet (5 mg total) by mouth daily.   aspirin 81 MG tablet Take 81 mg by mouth daily.     atorvastatin (LIPITOR) 20 MG tablet Take 1 tablet (20 mg total) by mouth daily.    Ferrous Sulfate (IRON) 325 (65 FE) MG TABS Take by mouth daily.   losartan (COZAAR) 25 MG tablet Take 1 tablet (25 mg total) by mouth daily.   Magnesium 250 MG TABS    Multiple Vitamin (MULTIVITAMIN) tablet Take 1 tablet by mouth daily.     Omega-3 Fatty Acids (FISH OIL) 1000 MG CAPS Take by mouth 2 (two) times daily.     No facility-administered encounter medications on file as of 01/16/2022.    Allergies (verified) Patient has no known allergies.   History: Past Medical History:  Diagnosis Date   Anemia    blood donor   Fuchs' corneal dystrophy    Dr. CPatrice Paradise [place]  Hyperlipidemia    Hypertension    Personal history of prostate cancer    Dr. KSerita Butcher  Past Surgical History:  Procedure Laterality Date   COLONOSCOPY  2009   Dr SFuller Plan virtual colonoscopy recommended  in 2013 because of tortuous colon   colonoscopy with polypectomy  2003   [place]   [place]   Family History  Problem Relation Age of Onset   Hypertension Mother    Heart attack Mother        in 769s  Atrial fibrillation Mother        Aortic Stenosis   Anemia Mother        ? slow GI bleed   Lung cancer Father  smoker   Breast cancer Sister    Dementia Paternal Grandmother    Stroke Neg Hx    Diabetes Neg Hx    Social History   Socioeconomic History   Marital status: Widowed    Spouse name: Not on file   Number of children: 1   Years of education: Not on file   Highest education level: Not on file  Occupational History   Occupation: Retired  Tobacco Use   Smoking status: Never   Smokeless tobacco: Never  Vaping Use   Vaping Use: Not on file  Substance and Sexual Activity   Alcohol use: Yes    Alcohol/week: 0.0 standard drinks of alcohol    Comment:  seldom   Drug use: No   Sexual activity: Not on file  Other Topics Concern   Not on file  Social History Narrative   Not on file    Social Determinants of Health   Financial Resource Strain: Low Risk  (01/16/2022)   Overall Financial Resource Strain (CARDIA)    Difficulty of Paying Living Expenses: Not hard at all  Food Insecurity: No Food Insecurity (01/16/2022)   Hunger Vital Sign    Worried About Running Out of Food in the Last Year: Never true    Ran Out of Food in the Last Year: Never true  Transportation Needs: No Transportation Needs (01/16/2022)   PRAPARE - Hydrologist (Medical): No    Lack of Transportation (Non-Medical): No  Physical Activity: Sufficiently Active (01/16/2022)   Exercise Vital Sign    Days of Exercise per Week: 7 days    Minutes of Exercise per Session: 30 min  Stress: No Stress Concern Present (01/16/2022)   [place]    Feeling of Stress : Not at all  Social Connections: Moderately Integrated (01/16/2022)   Social Connection and Isolation Panel [NHANES]    Frequency of Communication with Friends and Family: More than three times a week    Frequency of Social Gatherings with Friends and Family: More than three times a week    Attends Religious Services: More than 4 times per year    Active Member of [employer] or Organizations: Yes    Attends Archivist Meetings: More than 4 times per year    Marital Status: Widowed    Tobacco Counseling Counseling given: Not Answered   Clinical Intake:  Pre-visit preparation completed: Yes  Pain : No/denies pain     BMI - recorded: 23.1 Nutritional Status: BMI of 19-24  Normal Nutritional Risks: None Diabetes: No  How often do you need to have someone help you when you read instructions, pamphlets, or other written materials from your doctor or pharmacy?: 1 - Never What is the last grade level you completed in school?: HSG  Diabetic? no  Interpreter Needed?: No  Information entered by :: Lisette Abu, LPN.   Activities of Daily  Living    01/16/2022   10:34 AM 01/26/2021    8:13 AM  In your present state of health, do you have any difficulty performing the following activities:  Hearing? 0 0  Vision? 0 0  Difficulty concentrating or making decisions? 0 0  Walking or climbing stairs? 0 0  Dressing or bathing? 0 0  Doing errands, shopping? 0 0  Preparing Food and eating ? N   Using the Toilet? N   In the past six months, have you accidently leaked urine? N  Do you have problems with loss of bowel control? N   Managing your Medications? N   Managing your Finances? N   Housekeeping or managing your Housekeeping? N     Patient Care Team: Binnie Rail, MD as PCP - General (Internal Medicine) [facility] as Consulting Physician (Ophthalmology)  Indicate any recent Medical Services you may have received from other than [facility] providers in the past year (date may be approximate).     Assessment:   This is a routine wellness examination for [place].  Hearing/Vision screen Hearing Screening - Comments:: Denies hearing difficulties   Vision Screening - Comments:: Wears rx glasses - up to date with routine eye exams with Georgeann Oppenheim, MD ([facility])   Dietary issues and exercise activities discussed: Current Exercise Habits: Home exercise routine, Type of exercise: walking, Time (Minutes): 60, Frequency (Times/Week): 7, Weekly Exercise (Minutes/Week): 420, Intensity: Moderate, Exercise limited by: None identified   Goals Addressed             This Visit's Progress    My goal is to stay physically active.        Depression Screen    01/16/2022   10:33 AM 01/16/2022   10:28 AM 01/26/2021    8:15 AM 01/26/2020    8:23 AM 01/25/2019    8:30 AM 01/23/2018    8:19 AM 01/22/2017    8:31 AM  PHQ 2/9 Scores  PHQ - 2 Score 0 0 0 0 0 0 0  PHQ- 9 Score   0   1     Fall Risk    01/16/2022   10:28 AM 01/26/2021    8:13 AM 01/26/2020    8:25 AM 01/25/2019    8:30 AM 01/23/2018    8:00  AM  Fall Risk   Falls in the past year? 0 0 0 0 No  Number falls in past yr: 0 0 0 0   Injury with Fall? 0 0 0    Risk for fall due to : No Fall Risks No Fall Risks     Follow up Falls prevention discussed Falls evaluation completed       FALL RISK PREVENTION PERTAINING TO THE HOME:  Any stairs in or around the home? Yes  If so, are there any without handrails? No  Home free of loose throw rugs in walkways, pet beds, electrical cords, etc? Yes  Adequate lighting in your home to reduce risk of falls? Yes   ASSISTIVE DEVICES UTILIZED TO PREVENT FALLS:  Life alert? No  Use of a cane, walker or w/c? No  Grab bars in the bathroom? No  Shower chair or bench in shower? No  Elevated toilet seat or a handicapped toilet? No   TIMED UP AND GO:  Was the test performed? No .  Length of time to ambulate 10 feet: n/a sec.   Appearance of gait: Gait not evaluated during this visit.  Cognitive Function:        01/16/2022   10:35 AM  6CIT Screen  What Year? 0 points  What month? 0 points  What time? 0 points  Count back from 20 0 points  Months in reverse 0 points  Repeat phrase 0 points  Total Score 0 points    Immunizations Immunization History  Administered Date(s) Administered   DTaP 03/01/2020   Fluad Quad(high Dose 65+) 01/25/2019, 01/26/2020, 01/26/2021   Influenza Split 02/26/2011   Influenza Whole 03/15/2002, 02/07/2007, 02/10/2008, 02/07/2009, 02/22/2010   Influenza,  High Dose Seasonal PF 02/09/2013, 01/22/2016, 02/17/2017, 01/23/2018   Influenza-Unspecified 02/08/2015   Moderna Sars-Covid-2 Vaccination 05/20/2019, 06/17/2019, 03/04/2020   Pneumococcal Conjugate-13 01/18/2015   Pneumococcal Polysaccharide-23 10/26/2009   Td 10/19/2009   Zoster Recombinat (Shingrix) 03/10/2020, 07/27/2020   Zoster, Live 05/06/2005    TDAP status: Due, Education has been provided regarding the importance of this vaccine. Advised may receive this vaccine at local pharmacy or Health  Dept. Aware to provide a copy of the vaccination record if obtained from local pharmacy or Health Dept. Verbalized acceptance and understanding.  Flu Vaccine status: Due, Education has been provided regarding the importance of this vaccine. Advised may receive this vaccine at local pharmacy or Health Dept. Aware to provide a copy of the vaccination record if obtained from local pharmacy or Health Dept. Verbalized acceptance and understanding.  Pneumococcal vaccine status: Up to date  Covid-19 vaccine status: Completed vaccines  Qualifies for Shingles Vaccine? Yes   Zostavax completed Yes   Shingrix Completed?: Yes  Screening Tests Health Maintenance  Topic Date Due   COVID-19 Vaccine (4 - Moderna series) 04/29/2020   INFLUENZA VACCINE  12/04/2021   TETANUS/TDAP  01/26/2022 (Originally 10/20/2019)   Pneumonia Vaccine 109+ Years old  Completed   Zoster Vaccines- Shingrix  Completed   HPV VACCINES  Aged Out    Health Maintenance  Health Maintenance Due  Topic Date Due   COVID-19 Vaccine (4 - Moderna series) 04/29/2020   INFLUENZA VACCINE  12/04/2021    Colorectal cancer screening: No longer required.   Lung Cancer Screening: (Low Dose CT Chest recommended if Age 67-80 years, 30 pack-year currently smoking OR have quit w/in 15years.) does not qualify.   Lung Cancer Screening Referral: no  Additional Screening:  Hepatitis C Screening: does not qualify; Completed no  Vision Screening: Recommended annual ophthalmology exams for early detection of glaucoma and other disorders of the eye. Is the patient up to date with their annual eye exam?  Yes  Who is the provider or what is the name of the office in which the patient attends annual eye exams? Georgeann Oppenheim, MD. If pt is not established with a provider, would they like to be referred to a provider to establish care? No .   Dental Screening: Recommended annual dental exams for proper oral hygiene  Community Resource Referral /  Chronic Care Management: CRR required this visit?  No   CCM required this visit?  No      Plan:     I have personally reviewed and noted the following in the patient's chart:   Medical and social history Use of alcohol, tobacco or illicit drugs  Current medications and supplements including opioid prescriptions. Patient is not currently taking opioid prescriptions. Functional ability and status Nutritional status Physical activity Advanced directives List of other physicians Hospitalizations, surgeries, and ER visits in previous 12 months Vitals Screenings to include cognitive, depression, and falls Referrals and appointments  In addition, I have reviewed and discussed with patient certain preventive protocols, quality metrics, and best practice recommendations. A written personalized care plan for preventive services as well as general preventive health recommendations were provided to patient.     Sheral Flow, LPN   [id number]   Nurse Notes:  Patient is cogitatively intact. There were no vitals filed for this visit.

## 2022-01-16 NOTE — Patient Instructions (Signed)
[name] , Thank you for taking time to come for your Medicare Wellness Visit. I appreciate your ongoing commitment to your health goals. Please review the following plan we discussed and let me know if I can assist you in the future.   Screening recommendations/referrals: Colonoscopy: Discontinued due to age Recommended yearly ophthalmology/optometry visit for glaucoma screening and checkup Recommended yearly dental visit for hygiene and checkup  Vaccinations: Influenza vaccine: due Fall 2023 Pneumococcal vaccine: 10/26/2009, 01/18/2015 Tdap vaccine: 10/19/2009; due every 10 years (overdue) Shingles vaccine: 03/10/2020, 07/27/2020   Covid-19: 05/20/2019, 06/17/2019, 03/04/2020  Advanced directives: Yes; Please bring a copy of your health care power of attorney and living will to the office at your convenience.  Conditions/risks identified: Yes; Client understands the importance of follow-up appointments with providers by attending scheduled visits and discussed goals to eat healthier, increase physical activity 5 times a week for 30 minutes each, exercise the brain by doing stimulating brain exercises (reading, adult coloring, crafting, listening to music, puzzles, etc.), socialize and enjoy life more, get enough sleep at least 8-9 hours average per night and make time for laughter.  Next appointment: Follow up in one year for your annual wellness visit.   Preventive Care 64 Years and Older, Male  Preventive care refers to lifestyle choices and visits with your health care provider that can promote health and wellness. What does preventive care include? A yearly physical exam. This is also called an annual well check. Dental exams once or twice a year. Routine eye exams. Ask your health care provider how often you should have your eyes checked. Personal lifestyle choices, including: Daily care of your teeth and gums. Regular physical activity. Eating a healthy diet. Avoiding tobacco and  drug use. Limiting alcohol use. Practicing safe sex. Taking low doses of aspirin every day. Taking vitamin and mineral supplements as recommended by your health care provider. What happens during an annual well check? The services and screenings done by your health care provider during your annual well check will depend on your age, overall health, lifestyle risk factors, and family history of disease. Counseling  Your health care provider may ask you questions about your: Alcohol use. Tobacco use. Drug use. Emotional well-being. Home and relationship well-being. Sexual activity. Eating habits. History of falls. Memory and ability to understand (cognition). Work and work Statistician. Screening  You may have the following tests or measurements: Height, weight, and BMI. Blood pressure. Lipid and cholesterol levels. These may be checked every 5 years, or more frequently if you are over 59 years old. Skin check. Lung cancer screening. You may have this screening every year starting at age 2 if you have a 30-pack-year history of smoking and currently smoke or have quit within the past 15 years. Fecal occult blood test (FOBT) of the stool. You may have this test every year starting at age 81. Flexible sigmoidoscopy or colonoscopy. You may have a sigmoidoscopy every 5 years or a colonoscopy every 10 years starting at age 81. Prostate cancer screening. Recommendations will vary depending on your family history and other risks. Hepatitis C blood test. Hepatitis B blood test. Sexually transmitted disease (STD) testing. Diabetes screening. This is done by checking your blood sugar (glucose) after you have not eaten for a while (fasting). You may have this done every 1-3 years. Abdominal aortic aneurysm (AAA) screening. You may need this if you are a current or former smoker. Osteoporosis. You may be screened starting at age 81 if you are at  high risk. Talk with your health care provider  about your test results, treatment options, and if necessary, the need for more tests. Vaccines  Your health care provider may recommend certain vaccines, such as: Influenza vaccine. This is recommended every year. Tetanus, diphtheria, and acellular pertussis (Tdap, Td) vaccine. You may need a Td booster every 10 years. Zoster vaccine. You may need this after age 81. Pneumococcal 13-valent conjugate (PCV13) vaccine. One dose is recommended after age 81. Pneumococcal polysaccharide (PPSV23) vaccine. One dose is recommended after age 81. Talk to your health care provider about which screenings and vaccines you need and how often you need them. This information is not intended to replace advice given to you by your health care provider. Make sure you discuss any questions you have with your health care provider. Document Released: 05/19/2015 Document Revised: 01/10/2016 Document Reviewed: 02/21/2015 Elsevier Interactive Patient Education  2017 [employer] Prevention in the Home Falls can cause injuries. They can happen to people of all ages. There are many things you can do to make your home safe and to help prevent falls. What can I do on the outside of my home? Regularly fix the edges of walkways and driveways and fix any cracks. Remove anything that might make you trip as you walk through a door, such as a raised step or threshold. Trim any bushes or trees on the path to your home. Use bright outdoor lighting. Clear any walking paths of anything that might make someone trip, such as rocks or tools. Regularly check to see if handrails are loose or broken. Make sure that both sides of any steps have handrails. Any raised decks and porches should have guardrails on the edges. Have any leaves, snow, or ice cleared regularly. Use sand or salt on walking paths during winter. Clean up any spills in your garage right away. This includes oil or grease spills. What can I do in the  bathroom? Use night lights. Install grab bars by the toilet and in the tub and shower. Do not use towel bars as grab bars. Use non-skid mats or decals in the tub or shower. If you need to sit down in the shower, use a plastic, non-slip stool. Keep the floor dry. Clean up any water that spills on the floor as soon as it happens. Remove soap buildup in the tub or shower regularly. Attach bath mats securely with double-sided non-slip rug tape. Do not have throw rugs and other things on the floor that can make you trip. What can I do in the bedroom? Use night lights. Make sure that you have a light by your bed that is easy to reach. Do not use any sheets or blankets that are too big for your bed. They should not hang down onto the floor. Have a firm chair that has side arms. You can use this for support while you get dressed. Do not have throw rugs and other things on the floor that can make you trip. What can I do in the kitchen? Clean up any spills right away. Avoid walking on wet floors. Keep items that you use a lot in easy-to-reach places. If you need to reach something above you, use a strong step stool that has a grab bar. Keep electrical cords out of the way. Do not use floor polish or wax that makes floors slippery. If you must use wax, use non-skid floor wax. Do not have throw rugs and other things on the floor that  can make you trip. What can I do with my stairs? Do not leave any items on the stairs. Make sure that there are handrails on both sides of the stairs and use them. Fix handrails that are broken or loose. Make sure that handrails are as long as the stairways. Check any carpeting to make sure that it is firmly attached to the stairs. Fix any carpet that is loose or worn. Avoid having throw rugs at the top or bottom of the stairs. If you do have throw rugs, attach them to the floor with carpet tape. Make sure that you have a light switch at the top of the stairs and the  bottom of the stairs. If you do not have them, ask someone to add them for you. What else can I do to help prevent falls? Wear shoes that: Do not have high heels. Have rubber bottoms. Are comfortable and fit you well. Are closed at the toe. Do not wear sandals. If you use a stepladder: Make sure that it is fully opened. Do not climb a closed stepladder. Make sure that both sides of the stepladder are locked into place. Ask someone to hold it for you, if possible. Clearly mark and make sure that you can see: Any grab bars or handrails. First and last steps. Where the edge of each step is. Use tools that help you move around (mobility aids) if they are needed. These include: Canes. Walkers. Scooters. Crutches. Turn on the lights when you go into a dark area. Replace any light bulbs as soon as they burn out. Set up your furniture so you have a clear path. Avoid moving your furniture around. If any of your floors are uneven, fix them. If there are any pets around you, be aware of where they are. Review your medicines with your doctor. Some medicines can make you feel dizzy. This can increase your chance of falling. Ask your doctor what other things that you can do to help prevent falls. This information is not intended to replace advice given to you by your health care provider. Make sure you discuss any questions you have with your health care provider. Document Released: 02/16/2009 Document Revised: 09/28/2015 Document Reviewed: 05/27/2014 Elsevier Interactive Patient Education  2017 [employer].

## 2022-01-16 NOTE — Telephone Encounter (Signed)
Patient returned call.  AWV was completed.

## 2022-01-27 ENCOUNTER — Encounter: Payer: Self-pay | Admitting: Internal Medicine

## 2022-01-27 NOTE — Patient Instructions (Addendum)
Flu immunization administered today.     Blood work was ordered.     Medications changes include :   none   Your prescription(s) have been sent to your pharmacy.     Return in about 1 year (around 01/29/2023) for Physical Exam.    Health Maintenance, Male Adopting a healthy lifestyle and getting preventive care are important in promoting health and wellness. Ask your health care provider about: The right schedule for you to have regular tests and exams. Things you can do on your own to prevent diseases and keep yourself healthy. What should I know about diet, weight, and exercise? Eat a healthy diet  Eat a diet that includes plenty of vegetables, fruits, low-fat dairy products, and lean protein. Do not eat a lot of foods that are high in solid fats, added sugars, or sodium. Maintain a healthy weight Body mass index (BMI) is used to identify weight problems. It estimates body fat based on height and weight. Your health care provider can help determine your BMI and help you achieve or maintain a healthy weight. Get regular exercise Get regular exercise. This is one of the most important things you can do for your health. Most adults should: Exercise for at least 150 minutes each week. The exercise should increase your heart rate and make you sweat (moderate-intensity exercise). Do strengthening exercises at least twice a week. This is in addition to the moderate-intensity exercise. Spend less time sitting. Even light physical activity can be beneficial. Watch cholesterol and blood lipids Have your blood tested for lipids and cholesterol at 81 years of age, then have this test every 5 years. Have your cholesterol levels checked more often if: Your lipid or cholesterol levels are high. You are older than 81 years of age. You are at high risk for heart disease. What should I know about cancer screening? Depending on your health history and family history, you may need to  have cancer screening at various ages. This may include screening for: Breast cancer. Cervical cancer. Colorectal cancer. Skin cancer. Lung cancer. What should I know about heart disease, diabetes, and high blood pressure? Blood pressure and heart disease High blood pressure causes heart disease and increases the risk of stroke. This is more likely to develop in people who have high blood pressure readings or are overweight. Have your blood pressure checked: Every 3-5 years if you are 43-2 years of age. Every year if you are 5 years old or older. Diabetes Have regular diabetes screenings. This checks your fasting blood sugar level. Have the screening done: Once every three years after age 13 if you are at a normal weight and have a low risk for diabetes. More often and at a younger age if you are overweight or have a high risk for diabetes. What should I know about preventing infection? Hepatitis B If you have a higher risk for hepatitis B, you should be screened for this virus. Talk with your health care provider to find out if you are at risk for hepatitis B infection. Hepatitis C Testing is recommended for: Everyone born from [birth year] through 1965. Anyone with known risk factors for hepatitis C. Sexually transmitted infections (STIs) Get screened for STIs, including gonorrhea and chlamydia, if: You are sexually active and are younger than 81 years of age. You are older than 81 years of age and your health care provider tells you that you are at risk for this type of infection. Your sexual activity has changed  since you were last screened, and you are at increased risk for chlamydia or gonorrhea. Ask your health care provider if you are at risk. Ask your health care provider about whether you are at high risk for HIV. Your health care provider may recommend a prescription medicine to help prevent HIV infection. If you choose to take medicine to prevent HIV, you should first get tested for  HIV. You should then be tested every 3 months for as long as you are taking the medicine. Pregnancy If you are about to stop having your period (premenopausal) and you may become pregnant, seek counseling before you get pregnant. Take 400 to 800 micrograms (mcg) of folic acid every day if you become pregnant. Ask for birth control (contraception) if you want to prevent pregnancy. Osteoporosis and menopause Osteoporosis is a disease in which the bones lose minerals and strength with aging. This can result in bone fractures. If you are 63 years old or older, or if you are at risk for osteoporosis and fractures, ask your health care provider if you should: Be screened for bone loss. Take a calcium or vitamin D supplement to lower your risk of fractures. Be given hormone replacement therapy (HRT) to treat symptoms of menopause. Follow these instructions at home: Alcohol use Do not drink alcohol if: Your health care provider tells you not to drink. You are pregnant, may be pregnant, or are planning to become pregnant. If you drink alcohol: Limit how much you have to: 0-1 drink a day. Know how much alcohol is in your drink. In the [place] one drink equals one 12 oz bottle of beer (355 mL), one 5 oz glass of wine (148 mL), or one 1 oz glass of hard liquor (44 mL). Lifestyle Do not use any products that contain nicotine or tobacco. These products include cigarettes, chewing tobacco, and vaping devices, such as e-cigarettes. If you need help quitting, ask your health care provider. Do not use street drugs. Do not share needles. Ask your health care provider for help if you need support or information about quitting drugs. General instructions Schedule regular health, dental, and eye exams. Stay current with your vaccines. Tell your health care provider if: You often feel depressed. You have ever been abused or do not feel safe at home. Summary Adopting a healthy lifestyle and getting preventive  care are important in promoting health and wellness. Follow your health care provider's instructions about healthy diet, exercising, and getting tested or screened for diseases. Follow your health care provider's instructions on monitoring your cholesterol and blood pressure. This information is not intended to replace advice given to you by your health care provider. Make sure you discuss any questions you have with your health care provider. Document Revised: 09/11/2020 Document Reviewed: 09/11/2020 Elsevier Patient Education  [place].

## 2022-01-27 NOTE — Progress Notes (Signed)
Subjective:    Patient ID: Ronald Ross, male    DOB: 04/10/1941, 81 y.o.   MRN: [id number]     HPI Ronald Ross is here for a physical exam.   No concerns.     Medications and allergies reviewed with patient and updated if appropriate.  Current Outpatient Medications on File Prior to Visit  Medication Sig Dispense Refill   aspirin 81 MG tablet Take 81 mg by mouth daily.       Ferrous Sulfate (IRON) 325 (65 FE) MG TABS Take by mouth daily.     Magnesium 250 MG TABS      Multiple Vitamin (MULTIVITAMIN) tablet Take 1 tablet by mouth daily.       Omega-3 Fatty Acids (FISH OIL) 1000 MG CAPS Take by mouth 2 (two) times daily.       No current facility-administered medications on file prior to visit.    Review of Systems  Constitutional:  Negative for chills and fever.  Eyes:  Negative for visual disturbance.  Respiratory:  Negative for cough, shortness of breath and wheezing.   Cardiovascular:  Negative for chest pain, palpitations and leg swelling.  Gastrointestinal:  Negative for abdominal pain, blood in stool, constipation, diarrhea and nausea.       No gerd   Genitourinary:  Negative for difficulty urinating, dysuria and hematuria.  Musculoskeletal:  Positive for arthralgias (knees) and back pain (can not walk long distances).  Skin:  Negative for rash.  Neurological:  Negative for dizziness, light-headedness and headaches.  Psychiatric/Behavioral:  Negative for dysphoric mood and sleep disturbance. The patient is not nervous/anxious.        Objective:   Vitals:   01/28/22 0910  BP: 120/74  Pulse: (!) 54  Temp: (!) 97.5 F (36.4 C)  SpO2: 98%   Filed Weights   01/28/22 0910  Weight: 156 lb (70.8 kg)   Body mass index is 22.38 kg/m.  BP Readings from Last 3 Encounters:  01/28/22 120/74  01/26/21 118/70  01/26/20 130/72    Wt Readings from Last 3 Encounters:  01/28/22 156 lb (70.8 kg)  01/16/22 161 lb (73 kg)  01/26/21 161 lb (73 kg)       Physical Exam Constitutional: He appears well-developed and well-nourished. No distress.  HENT:  Head: Normocephalic and atraumatic.  Right Ear: External ear normal.  Left Ear: External ear normal.  Mouth/Throat: Oropharynx is clear and moist.  Normal ear canals and TM b/l  Eyes: Conjunctivae and EOM are normal.  Neck: Neck Ross. No tracheal deviation present. No thyromegaly present.  No carotid bruit  Cardiovascular: Normal rate, regular rhythm, normal heart sounds and intact distal pulses.   No murmur heard. Pulmonary/Chest: Effort normal and breath sounds normal. No respiratory distress. He has no wheezes. He has no rales.  Abdominal: Soft. He exhibits no distension. There is no tenderness.  Genitourinary: deferred  Musculoskeletal: He exhibits no edema.  Lymphadenopathy:   He has no cervical adenopathy.  Skin: Skin is warm and dry. He is not diaphoretic.  Psychiatric: He has a normal mood and affect. His behavior is normal.         Assessment & Plan:   Physical exam: Screening blood work  ordered Exercise   walking some - can not walk long distance due to back pain Weight  normal Substance abuse   none   Reviewed recommended immunizations.  Flu vaccine today   Health Maintenance  Topic Date Due   COVID-19 Vaccine (4 -  Moderna series) 02/13/2022 (Originally 04/29/2020)   TETANUS/TDAP  01/29/2023 (Originally 10/20/2019)   Pneumonia Vaccine 70+ Years old  Completed   INFLUENZA VACCINE  Completed   Zoster Vaccines- Shingrix  Completed   HPV VACCINES  Aged Out     See Problem List for Assessment and Plan of chronic medical problems.

## 2022-01-28 ENCOUNTER — Ambulatory Visit (INDEPENDENT_AMBULATORY_CARE_PROVIDER_SITE_OTHER): Payer: Medicare HMO | Admitting: Internal Medicine

## 2022-01-28 ENCOUNTER — Encounter: Payer: Self-pay | Admitting: Internal Medicine

## 2022-01-28 VITALS — BP 120/74 | HR 54 | Temp 97.5°F | Ht 70.0 in | Wt 156.0 lb

## 2022-01-28 DIAGNOSIS — E7849 Other hyperlipidemia: Secondary | ICD-10-CM | POA: Diagnosis not present

## 2022-01-28 DIAGNOSIS — Z23 Encounter for immunization: Secondary | ICD-10-CM | POA: Diagnosis not present

## 2022-01-28 DIAGNOSIS — Z0000 Encounter for general adult medical examination without abnormal findings: Secondary | ICD-10-CM

## 2022-01-28 DIAGNOSIS — R7303 Prediabetes: Secondary | ICD-10-CM

## 2022-01-28 DIAGNOSIS — I10 Essential (primary) hypertension: Secondary | ICD-10-CM | POA: Diagnosis not present

## 2022-01-28 LAB — COMPREHENSIVE METABOLIC PANEL
ALT: 23 U/L (ref 0–53)
AST: 25 U/L (ref 0–37)
Albumin: 4.1 g/dL (ref 3.5–5.2)
Alkaline Phosphatase: 68 U/L (ref 39–117)
BUN: 25 mg/dL — ABNORMAL HIGH (ref 6–23)
CO2: 29 mEq/L (ref 19–32)
Calcium: 9.4 mg/dL (ref 8.4–10.5)
Chloride: 108 mEq/L (ref 96–112)
Creatinine, Ser: 1.22 mg/dL (ref 0.40–1.50)
GFR: 55.63 mL/min — ABNORMAL LOW (ref >60.00)
Glucose, Bld: 92 mg/dL (ref 70–99)
Potassium: 4.4 mEq/L (ref 3.5–5.1)
Sodium: 144 mEq/L (ref 135–145)
Total Bilirubin: 0.6 mg/dL (ref 0.2–1.2)
Total Protein: 6.9 g/dL (ref 6.0–8.3)

## 2022-01-28 LAB — CBC WITH DIFFERENTIAL/PLATELET
Basophils Absolute: 0 10*3/uL (ref 0.0–0.1)
Basophils Relative: 0.7 % (ref 0.0–3.0)
Eosinophils Absolute: 0.2 10*3/uL (ref 0.0–0.7)
Eosinophils Relative: 2.6 % (ref 0.0–5.0)
HCT: 42.3 % (ref 39.0–52.0)
Hemoglobin: 14.6 g/dL (ref 13.0–17.0)
Lymphocytes Relative: 35.3 % (ref 12.0–46.0)
Lymphs Abs: 2.2 10*3/uL (ref 0.7–4.0)
MCHC: 34.5 g/dL (ref 30.0–36.0)
MCV: 94.4 fl (ref 78.0–100.0)
Monocytes Absolute: 0.4 10*3/uL (ref 0.1–1.0)
Monocytes Relative: 7.2 % (ref 3.0–12.0)
Neutro Abs: 3.4 10*3/uL (ref 1.4–7.7)
Neutrophils Relative %: 54.2 % (ref 43.0–77.0)
Platelets: 181 10*3/uL (ref 150.0–400.0)
RBC: 4.48 Mil/uL (ref 4.22–5.81)
RDW: 12.8 % (ref 11.5–15.5)
WBC: 6.2 10*3/uL (ref 4.0–10.5)

## 2022-01-28 LAB — LIPID PANEL
Cholesterol: 147 mg/dL (ref 0–200)
HDL: 46.7 mg/dL (ref >39.00)
LDL Cholesterol: 89 mg/dL (ref 0–99)
NonHDL: 100.39
Total CHOL/HDL Ratio: 3
Triglycerides: 57 mg/dL (ref 0.0–149.0)
VLDL: 11.4 mg/dL (ref 0.0–40.0)

## 2022-01-28 MED ORDER — ATORVASTATIN CALCIUM 20 MG PO TABS: 20.0000 mg | ORAL_TABLET | Freq: Every day | ORAL | 3 refills | Status: DC

## 2022-01-28 MED ORDER — LOSARTAN POTASSIUM 25 MG PO TABS: 25.0000 mg | ORAL_TABLET | Freq: Every day | ORAL | 3 refills | Status: DC

## 2022-01-28 MED ORDER — AMLODIPINE BESYLATE 5 MG PO TABS: 5.0000 mg | ORAL_TABLET | Freq: Every day | ORAL | 3 refills | Status: DC

## 2022-01-28 NOTE — Assessment & Plan Note (Signed)
Chronic BP well controlled Continue amlodipine 5 mg daily, losartan 25 mg daily cmp

## 2022-01-28 NOTE — Assessment & Plan Note (Signed)
Chronic Check a1c Low sugar / carb diet Stressed regular exercise  

## 2022-01-28 NOTE — Assessment & Plan Note (Signed)
Chronic Check lipid panel  Continue atorvastatin 20 mg daily Regular exercise and healthy diet encouraged  

## 2022-01-29 LAB — HEMOGLOBIN A1C: Hgb A1c MFr Bld: 5.8 % (ref 4.6–6.5)

## 2022-01-30 ENCOUNTER — Encounter: Payer: Self-pay | Admitting: Internal Medicine

## 2022-03-27 ENCOUNTER — Encounter: Payer: Self-pay | Admitting: Internal Medicine

## 2022-04-15 DIAGNOSIS — H35373 Puckering of macula, bilateral: Secondary | ICD-10-CM | POA: Diagnosis not present

## 2022-04-15 DIAGNOSIS — H02831 Dermatochalasis of right upper eyelid: Secondary | ICD-10-CM | POA: Diagnosis not present

## 2022-04-15 DIAGNOSIS — Z961 Presence of intraocular lens: Secondary | ICD-10-CM | POA: Diagnosis not present

## 2022-04-15 DIAGNOSIS — H47323 Drusen of optic disc, bilateral: Secondary | ICD-10-CM | POA: Diagnosis not present

## 2022-04-15 DIAGNOSIS — H04123 Dry eye syndrome of bilateral lacrimal glands: Secondary | ICD-10-CM | POA: Diagnosis not present

## 2022-04-15 DIAGNOSIS — H353131 Nonexudative age-related macular degeneration, bilateral, early dry stage: Secondary | ICD-10-CM | POA: Diagnosis not present

## 2022-04-15 DIAGNOSIS — H43813 Vitreous degeneration, bilateral: Secondary | ICD-10-CM | POA: Diagnosis not present

## 2022-04-15 DIAGNOSIS — H18513 Endothelial corneal dystrophy, bilateral: Secondary | ICD-10-CM | POA: Diagnosis not present

## 2022-04-15 DIAGNOSIS — H02834 Dermatochalasis of left upper eyelid: Secondary | ICD-10-CM | POA: Diagnosis not present

## 2022-06-13 ENCOUNTER — Telehealth: Payer: Self-pay

## 2022-06-13 NOTE — Telephone Encounter (Signed)
Cologuard recall due, would you like to proceed due to age?

## 2022-09-26 DIAGNOSIS — H47323 Drusen of optic disc, bilateral: Secondary | ICD-10-CM | POA: Diagnosis not present

## 2022-10-16 ENCOUNTER — Encounter (INDEPENDENT_AMBULATORY_CARE_PROVIDER_SITE_OTHER): Payer: Medicare HMO | Admitting: Ophthalmology

## 2022-10-16 DIAGNOSIS — I10 Essential (primary) hypertension: Secondary | ICD-10-CM | POA: Diagnosis not present

## 2022-10-16 DIAGNOSIS — H35373 Puckering of macula, bilateral: Secondary | ICD-10-CM | POA: Diagnosis not present

## 2022-10-16 DIAGNOSIS — H35033 Hypertensive retinopathy, bilateral: Secondary | ICD-10-CM

## 2022-10-16 DIAGNOSIS — H43813 Vitreous degeneration, bilateral: Secondary | ICD-10-CM

## 2022-12-19 ENCOUNTER — Encounter (INDEPENDENT_AMBULATORY_CARE_PROVIDER_SITE_OTHER): Payer: Self-pay

## 2022-12-20 ENCOUNTER — Other Ambulatory Visit: Payer: Self-pay | Admitting: Oncology

## 2022-12-20 DIAGNOSIS — Z006 Encounter for examination for normal comparison and control in clinical research program: Secondary | ICD-10-CM

## 2023-01-07 ENCOUNTER — Ambulatory Visit: Payer: Medicare HMO

## 2023-01-07 VITALS — Ht 70.0 in | Wt 157.0 lb

## 2023-01-07 DIAGNOSIS — Z0000 Encounter for general adult medical examination without abnormal findings: Secondary | ICD-10-CM | POA: Diagnosis not present

## 2023-01-07 NOTE — Progress Notes (Signed)
Subjective:   Ronald Ross is a 82 y.o. male who presents for Medicare Annual/Subsequent preventive examination.  Visit Complete: Virtual  I connected with  SYD SANCEN on 01/07/23 by a audio enabled telemedicine application and verified that I am speaking with the correct person using two identifiers.  Patient Location: Home  Provider Location: Home Office  I discussed the limitations of evaluation and management by telemedicine. The patient expressed understanding and agreed to proceed.  Patient Medicare AWV questionnaire was completed by the patient on 01/03/2023; I have confirmed that all information answered by patient is correct and no changes since this date.  Vital Signs: Because this visit was a virtual/telehealth visit, some criteria may be missing or patient reported. Any vitals not documented were not able to be obtained and vitals that have been documented are patient reported.   Review of Systems      Cardiac Risk Factors include: advanced age (>67men, >69 women);dyslipidemia;family history of premature cardiovascular disease;hypertension;male gender     Objective:    Today's Vitals   01/07/23 0947  Weight: 157 lb (71.2 kg)  Height: 5\' 10"  (1.778 m)   Body mass index is 22.53 kg/m.     01/07/2023   10:00 AM 01/16/2022   10:23 AM 01/18/2015    4:36 PM  Advanced Directives  Does Patient Have a Medical Advance Directive? Yes Yes Yes  Type of Estate agent of [place];Living will Healthcare Power of [place];Living will   Copy of Healthcare Power of Attorney in Chart? No - copy requested No - copy requested Yes    Current Medications (verified) Outpatient Encounter Medications as of 01/07/2023  Medication Sig   amLODipine (NORVASC) 5 MG tablet Take 1 tablet (5 mg total) by mouth daily.   aspirin 81 MG tablet Take 81 mg by mouth daily.     atorvastatin (LIPITOR) 20 MG tablet Take 1 tablet (20 mg total) by mouth daily.   Ferrous  Sulfate (IRON) 325 (65 FE) MG TABS Take by mouth daily.   losartan (COZAAR) 25 MG tablet Take 1 tablet (25 mg total) by mouth daily.   Magnesium 250 MG TABS    Multiple Vitamin (MULTIVITAMIN) tablet Take 1 tablet by mouth daily.     Omega-3 Fatty Acids (FISH OIL) 1000 MG CAPS Take by mouth 2 (two) times daily.     No facility-administered encounter medications on file as of 01/07/2023.    Allergies (verified) Patient has no known allergies.   History: Past Medical History:  Diagnosis Date   Anemia    blood donor   Fuchs' corneal dystrophy    Dr. Noel Gerold; [facility] , [place]   Hyperlipidemia    Hypertension    Personal history of prostate cancer    Dr. Aldean Ast   Past Surgical History:  Procedure Laterality Date   COLONOSCOPY  2009   Dr Russella Dar, virtual colonoscopy recommended  in 2013 because of tortuous colon   colonoscopy with polypectomy  2003   EYE SURGERY  1999 & 2010   cataract & implant bilaterally   PROSTATE SURGERY  1998   prostatectomy   Family History  Problem Relation Age of Onset   Hypertension Mother    Heart attack Mother        in 100s   Atrial fibrillation Mother        Aortic Stenosis   Anemia Mother        ? slow GI bleed   Lung cancer Father  smoker   Breast cancer Sister    Dementia Paternal Grandmother    Stroke Neg Hx    Diabetes Neg Hx    Social History   Socioeconomic History   Marital status: Widowed    Spouse name: Not on file   Number of children: 1   Years of education: Not on file   Highest education level: Not on file  Occupational History   Occupation: Retired  Tobacco Use   Smoking status: Never   Smokeless tobacco: Never  Vaping Use   Vaping status: Not on file  Substance and Sexual Activity   Alcohol use: Yes    Alcohol/week: 0.0 standard drinks of alcohol    Comment:  seldom   Drug use: No   Sexual activity: Not on file  Other Topics Concern   Not on file  Social History Narrative   Not on file   Social  Determinants of Health   Financial Resource Strain: Low Risk  (01/07/2023)   Overall Financial Resource Strain (CARDIA)    Difficulty of Paying Living Expenses: Not hard at all  Food Insecurity: No Food Insecurity (01/07/2023)   Hunger Vital Sign    Worried About Running Out of Food in the Last Year: Never true    Ran Out of Food in the Last Year: Never true  Transportation Needs: No Transportation Needs (01/07/2023)   PRAPARE - Administrator, Civil Service (Medical): No    Lack of Transportation (Non-Medical): No  Physical Activity: Insufficiently Active (01/07/2023)   Exercise Vital Sign    Days of Exercise per Week: 6 days    Minutes of Exercise per Session: 20 min  Stress: No Stress Concern Present (01/07/2023)   [employer] of Occupational Health - Occupational Stress Questionnaire    Feeling of Stress : Not at all  Social Connections: Moderately Integrated (01/07/2023)   Social Connection and Isolation Panel [NHANES]    Frequency of Communication with Friends and Family: Twice a week    Frequency of Social Gatherings with Friends and Family: Twice a week    Attends Religious Services: More than 4 times per year    Active Member of [employer] or Organizations: Yes    Attends Banker Meetings: More than 4 times per year    Marital Status: Widowed    Tobacco Counseling Counseling given: Not Answered   Clinical Intake:  Pre-visit preparation completed: Yes  Pain : No/denies pain     BMI - recorded: 22.53 Nutritional Status: BMI of 19-24  Normal Nutritional Risks: None Diabetes: No  How often do you need to have someone help you when you read instructions, pamphlets, or other written materials from your doctor or pharmacy?: 1 - Never What is the last grade level you completed in school?: 2 years of graduate study  Interpreter Needed?: No  Information entered by :: Reis Pienta N. Filbert Craze, LPN.   Activities of Daily Living    01/07/2023   10:01 AM  01/03/2023   11:19 AM  In your present state of health, do you have any difficulty performing the following activities:  Hearing? 0 0  Vision? 0 0  Difficulty concentrating or making decisions? 0 0  Walking or climbing stairs? 0 0  Dressing or bathing? 0 0  Doing errands, shopping? 0 0  Preparing Food and eating ? N N  Using the Toilet? N N  In the past six months, have you accidently leaked urine? Malvin Johns  Do you have problems  with loss of bowel control? N N  Managing your Medications? N N  Managing your Finances? N N  Housekeeping or managing your Housekeeping? N N    Patient Care Team: Pincus Sanes, MD as PCP - General (Internal Medicine) [facility] as Consulting Physician (Ophthalmology) Elpidio Galea, MD as Consulting Physician (Ophthalmology)  Indicate any recent Medical Services you may have received from other than [facility] providers in the past year (date may be approximate).     Assessment:   This is a routine wellness examination for Davari.  Hearing/Vision screen Hearing Screening - Comments:: Denies hearing difficulties   Vision Screening - Comments:: Wears rx glasses - up to date with routine eye exams with Elpidio Galea, MD at [facility]   Dietary issues and exercise activities discussed:     Goals Addressed             This Visit's Progress    My goal is to stay physically active.        Depression Screen    01/07/2023    9:57 AM 01/16/2022   10:33 AM 01/16/2022   10:28 AM 01/26/2021    8:15 AM 01/26/2020    8:23 AM 01/25/2019    8:30 AM 01/23/2018    8:19 AM  PHQ 2/9 Scores  PHQ - 2 Score 0 0 0 0 0 0 0  PHQ- 9 Score 0   0   1    Fall Risk    01/07/2023   10:01 AM 01/03/2023   11:19 AM 01/16/2022   10:28 AM 01/26/2021    8:13 AM 01/26/2020    8:25 AM  Fall Risk   Falls in the past year? 0 0 0 0 0  Number falls in past yr: 0 0 0 0 0  Injury with Fall? 0 0 0 0 0  Risk for fall due to : No Fall Risks  No Fall Risks No Fall  Risks   Follow up Falls prevention discussed  Falls prevention discussed Falls evaluation completed     MEDICARE RISK AT HOME: Medicare Risk at Home Any stairs in or around the home?: Yes If so, are there any without handrails?: No Home free of loose throw rugs in walkways, pet beds, electrical cords, etc?: Yes Adequate lighting in your home to reduce risk of falls?: Yes Life alert?: No Use of a cane, walker or w/c?: No Grab bars in the bathroom?: No Shower chair or bench in shower?: No Elevated toilet seat or a handicapped toilet?: No  TIMED UP AND GO:  Was the test performed?  No    Cognitive Function:        01/07/2023   10:02 AM 01/16/2022   10:35 AM  6CIT Screen  What Year? 0 points 0 points  What month? 0 points 0 points  What time? 0 points 0 points  Count back from 20 0 points 0 points  Months in reverse 0 points 0 points  Repeat phrase 0 points 0 points  Total Score 0 points 0 points    Immunizations Immunization History  Administered Date(s) Administered   DTaP 03/01/2020   Fluad Quad(high Dose 65+) 01/25/2019, 01/26/2020, 01/26/2021, 01/28/2022   Influenza Split 02/26/2011   Influenza Whole 03/15/2002, 02/07/2007, 02/10/2008, 02/07/2009, 02/22/2010   Influenza, High Dose Seasonal PF 02/09/2013, 01/22/2016, 02/17/2017, 01/23/2018   Influenza-Unspecified 02/08/2015   Moderna Sars-Covid-2 Vaccination 05/20/2019, 06/17/2019, 03/04/2020   Pfizer Covid-19 Vaccine Bivalent Booster 71yrs & up 02/14/2022   Pneumococcal  Conjugate-13 01/18/2015   Pneumococcal Polysaccharide-23 10/26/2009   Rsv, Mab, Nirsevimab-alip, 0.5 Ml, Neonate To 24 Mos(Beyfortus) 03/07/2022   Td 10/19/2009   Tdap 03/01/2020   Zoster Recombinant(Shingrix) 03/10/2020, 07/27/2020   Zoster, Live 05/06/2005    TDAP status: Up to date  Flu Vaccine status: Due, Education has been provided regarding the importance of this vaccine. Advised may receive this vaccine at local pharmacy or Health Dept.  Aware to provide a copy of the vaccination record if obtained from local pharmacy or Health Dept. Verbalized acceptance and understanding.  Pneumococcal vaccine status: Up to date  Covid-19 vaccine status: Information provided on how to obtain vaccines.   Qualifies for Shingles Vaccine? Yes   Zostavax completed No   Shingrix Completed?: Yes  Screening Tests Health Maintenance  Topic Date Due   INFLUENZA VACCINE  12/05/2022   COVID-19 Vaccine (5 - 2023-24 season) 01/05/2023   Medicare Annual Wellness (AWV)  01/07/2024   DTaP/Tdap/Td (4 - Td or Tdap) 03/01/2030   Pneumonia Vaccine 1+ Years old  Completed   Zoster Vaccines- Shingrix  Completed   HPV VACCINES  Aged Out   Hepatitis C Screening  Discontinued    Health Maintenance  Health Maintenance Due  Topic Date Due   INFLUENZA VACCINE  12/05/2022   COVID-19 Vaccine (5 - 2023-24 season) 01/05/2023    Colorectal cancer screening: No longer required.   Lung Cancer Screening: (Low Dose CT Chest recommended if Age 85-80 years, 20 pack-year currently smoking OR have quit w/in 15years.) does not qualify.   Lung Cancer Screening Referral: no  Additional Screening:  Hepatitis C Screening: does qualify; Completed 01/26/2020  Vision Screening: Recommended annual ophthalmology exams for early detection of glaucoma and other disorders of the eye. Is the patient up to date with their annual eye exam?  Yes  Who is the provider or what is the name of the office in which the patient attends annual eye exams? Elpidio Galea, MD. If pt is not established with a provider, would they like to be referred to a provider to establish care? No .   Dental Screening: Recommended annual dental exams for proper oral hygiene  Diabetic Foot Exam: N/A   Community Resource Referral / Chronic Care Management: CRR required this visit?  No   CCM required this visit?  No     Plan:     I have personally reviewed and noted the following in the  patient's chart:   Medical and social history Use of alcohol, tobacco or illicit drugs  Current medications and supplements including opioid prescriptions. Patient is not currently taking opioid prescriptions. Functional ability and status Nutritional status Physical activity Advanced directives List of other physicians Hospitalizations, surgeries, and ER visits in previous 12 months Vitals Screenings to include cognitive, depression, and falls Referrals and appointments  In addition, I have reviewed and discussed with patient certain preventive protocols, quality metrics, and best practice recommendations. A written personalized care plan for preventive services as well as general preventive health recommendations were provided to patient.    Mickeal Needy, LPN   09/05/8411   After Visit Summary: (MyChart) Due to this being a telephonic visit, the after visit summary with patients personalized plan was offered to patient via MyChart   Nurse Notes: None

## 2023-01-07 NOTE — Patient Instructions (Addendum)
Ronald Ross , Thank you for taking time to come for your Medicare Wellness Visit. I appreciate your ongoing commitment to your health goals. Please review the following plan we discussed and let me know if I can assist you in the future.   Referrals/Orders/Follow-Ups/Clinician Recommendations: None  This is a list of the screening recommended for you and due dates:  Health Maintenance  Topic Date Due   Flu Shot  12/05/2022   COVID-19 Vaccine (5 - 2023-24 season) 01/05/2023   Medicare Annual Wellness Visit  01/07/2024   DTaP/Tdap/Td vaccine (4 - Td or Tdap) 03/01/2030   Pneumonia Vaccine  Completed   Zoster (Shingles) Vaccine  Completed   HPV Vaccine  Aged Out   Hepatitis C Screening  Discontinued    Advanced directives: (Copy Requested) Please bring a copy of your health care power of attorney and living will to the office to be added to your chart at your convenience.  Next Medicare Annual Wellness Visit scheduled for next year: Yes

## 2023-01-19 ENCOUNTER — Encounter: Payer: Self-pay | Admitting: Internal Medicine

## 2023-01-19 NOTE — Patient Instructions (Addendum)
Blood work was ordered.   The lab is on the first floor.    Medications changes include :       A referral was ordered and someone will call you to schedule an appointment.     Return in about 1 year (around 01/28/2024) for Physical Exam.   Health Maintenance, Male Adopting a healthy lifestyle and getting preventive care are important in promoting health and wellness. Ask your health care provider about: The right schedule for you to have regular tests and exams. Things you can do on your own to prevent diseases and keep yourself healthy. What should I know about diet, weight, and exercise? Eat a healthy diet  Eat a diet that includes plenty of vegetables, fruits, low-fat dairy products, and lean protein. Do not eat a lot of foods that are high in solid fats, added sugars, or sodium. Maintain a healthy weight Body mass index (BMI) is a measurement that can be used to identify possible weight problems. It estimates body fat based on height and weight. Your health care provider can help determine your BMI and help you achieve or maintain a healthy weight. Get regular exercise Get regular exercise. This is one of the most important things you can do for your health. Most adults should: Exercise for at least 150 minutes each week. The exercise should increase your heart rate and make you sweat (moderate-intensity exercise). Do strengthening exercises at least twice a week. This is in addition to the moderate-intensity exercise. Spend less time sitting. Even light physical activity can be beneficial. Watch cholesterol and blood lipids Have your blood tested for lipids and cholesterol at 82 years of age, then have this test every 5 years. You may need to have your cholesterol levels checked more often if: Your lipid or cholesterol levels are high. You are older than 82 years of age. You are at high risk for heart disease. What should I know about cancer screening? Many types  of cancers can be detected early and may often be prevented. Depending on your health history and family history, you may need to have cancer screening at various ages. This may include screening for: Colorectal cancer. Prostate cancer. Skin cancer. Lung cancer. What should I know about heart disease, diabetes, and high blood pressure? Blood pressure and heart disease High blood pressure causes heart disease and increases the risk of stroke. This is more likely to develop in people who have high blood pressure readings or are overweight. Talk with your health care provider about your target blood pressure readings. Have your blood pressure checked: Every 3-5 years if you are 56-2 years of age. Every year if you are 44 years old or older. If you are between the ages of 69 and 77 and are a current or former smoker, ask your health care provider if you should have a one-time screening for abdominal aortic aneurysm (AAA). Diabetes Have regular diabetes screenings. This checks your fasting blood sugar level. Have the screening done: Once every three years after age 23 if you are at a normal weight and have a low risk for diabetes. More often and at a younger age if you are overweight or have a high risk for diabetes. What should I know about preventing infection? Hepatitis B If you have a higher risk for hepatitis B, you should be screened for this virus. Talk with your health care provider to find out if you are at risk for hepatitis B infection. Hepatitis  C Blood testing is recommended for: Everyone born from [birth year] through 1965. Anyone with known risk factors for hepatitis C. Sexually transmitted infections (STIs) You should be screened each year for STIs, including gonorrhea and chlamydia, if: You are sexually active and are younger than 82 years of age. You are older than 82 years of age and your health care provider tells you that you are at risk for this type of infection. Your sexual  activity has changed since you were last screened, and you are at increased risk for chlamydia or gonorrhea. Ask your health care provider if you are at risk. Ask your health care provider about whether you are at high risk for HIV. Your health care provider may recommend a prescription medicine to help prevent HIV infection. If you choose to take medicine to prevent HIV, you should first get tested for HIV. You should then be tested every 3 months for as long as you are taking the medicine. Follow these instructions at home: Alcohol use Do not drink alcohol if your health care provider tells you not to drink. If you drink alcohol: Limit how much you have to 0-2 drinks a day. Know how much alcohol is in your drink. In the [place] one drink equals one 12 oz bottle of beer (355 mL), one 5 oz glass of wine (148 mL), or one 1 oz glass of hard liquor (44 mL). Lifestyle Do not use any products that contain nicotine or tobacco. These products include cigarettes, chewing tobacco, and vaping devices, such as e-cigarettes. If you need help quitting, ask your health care provider. Do not use street drugs. Do not share needles. Ask your health care provider for help if you need support or information about quitting drugs. General instructions Schedule regular health, dental, and eye exams. Stay current with your vaccines. Tell your health care provider if: You often feel depressed. You have ever been abused or do not feel safe at home. Summary Adopting a healthy lifestyle and getting preventive care are important in promoting health and wellness. Follow your health care provider's instructions about healthy diet, exercising, and getting tested or screened for diseases. Follow your health care provider's instructions on monitoring your cholesterol and blood pressure. This information is not intended to replace advice given to you by your health care provider. Make sure you discuss any questions you have  with your health care provider. Document Revised: 09/11/2020 Document Reviewed: 09/11/2020 Elsevier Patient Education  2024 [employer].

## 2023-01-28 ENCOUNTER — Encounter: Payer: Medicare HMO | Admitting: Internal Medicine

## 2023-01-28 DIAGNOSIS — Z8546 Personal history of malignant neoplasm of prostate: Secondary | ICD-10-CM

## 2023-01-28 DIAGNOSIS — Z0000 Encounter for general adult medical examination without abnormal findings: Secondary | ICD-10-CM

## 2023-01-28 DIAGNOSIS — R7303 Prediabetes: Secondary | ICD-10-CM

## 2023-01-28 DIAGNOSIS — Z125 Encounter for screening for malignant neoplasm of prostate: Secondary | ICD-10-CM

## 2023-01-28 DIAGNOSIS — E7849 Other hyperlipidemia: Secondary | ICD-10-CM

## 2023-01-28 DIAGNOSIS — I10 Essential (primary) hypertension: Secondary | ICD-10-CM

## 2023-02-05 ENCOUNTER — Encounter: Payer: Self-pay | Admitting: Internal Medicine

## 2023-02-06 ENCOUNTER — Other Ambulatory Visit: Payer: Self-pay

## 2023-02-06 DIAGNOSIS — I10 Essential (primary) hypertension: Secondary | ICD-10-CM

## 2023-02-06 DIAGNOSIS — E7849 Other hyperlipidemia: Secondary | ICD-10-CM

## 2023-02-06 MED ORDER — LOSARTAN POTASSIUM 25 MG PO TABS
25.0000 mg | ORAL_TABLET | Freq: Every day | ORAL | 0 refills | Status: DC
Start: 2023-02-06 — End: 2023-02-13

## 2023-02-06 MED ORDER — AMLODIPINE BESYLATE 5 MG PO TABS: 5.0000 mg | ORAL_TABLET | Freq: Every day | ORAL | 0 refills | Status: DC

## 2023-02-06 MED ORDER — ATORVASTATIN CALCIUM 20 MG PO TABS: 20.0000 mg | ORAL_TABLET | Freq: Every day | ORAL | 0 refills | Status: DC

## 2023-02-12 ENCOUNTER — Encounter: Payer: Self-pay | Admitting: Internal Medicine

## 2023-02-12 NOTE — Patient Instructions (Addendum)
Flu immunization administered today.      Blood work was ordered.   The lab is on the first floor.    Medications changes include :   None    Return in about 1 year (around 02/13/2024) for Physical Exam.   Health Maintenance, Male Adopting a healthy lifestyle and getting preventive care are important in promoting health and wellness. Ask your health care provider about: The right schedule for you to have regular tests and exams. Things you can do on your own to prevent diseases and keep yourself healthy. What should I know about diet, weight, and exercise? Eat a healthy diet  Eat a diet that includes plenty of vegetables, fruits, low-fat dairy products, and lean protein. Do not eat a lot of foods that are high in solid fats, added sugars, or sodium. Maintain a healthy weight Body mass index (BMI) is a measurement that can be used to identify possible weight problems. It estimates body fat based on height and weight. Your health care provider can help determine your BMI and help you achieve or maintain a healthy weight. Get regular exercise Get regular exercise. This is one of the most important things you can do for your health. Most adults should: Exercise for at least 150 minutes each week. The exercise should increase your heart rate and make you sweat (moderate-intensity exercise). Do strengthening exercises at least twice a week. This is in addition to the moderate-intensity exercise. Spend less time sitting. Even light physical activity can be beneficial. Watch cholesterol and blood lipids Have your blood tested for lipids and cholesterol at 82 years of age, then have this test every 5 years. You may need to have your cholesterol levels checked more often if: Your lipid or cholesterol levels are high. You are older than 82 years of age. You are at high risk for heart disease. What should I know about cancer screening? Many types of cancers can be detected early and may  often be prevented. Depending on your health history and family history, you may need to have cancer screening at various ages. This may include screening for: Colorectal cancer. Prostate cancer. Skin cancer. Lung cancer. What should I know about heart disease, diabetes, and high blood pressure? Blood pressure and heart disease High blood pressure causes heart disease and increases the risk of stroke. This is more likely to develop in people who have high blood pressure readings or are overweight. Talk with your health care provider about your target blood pressure readings. Have your blood pressure checked: Every 3-5 years if you are 69-26 years of age. Every year if you are 55 years old or older. If you are between the ages of 5 and 46 and are a current or former smoker, ask your health care provider if you should have a one-time screening for abdominal aortic aneurysm (AAA). Diabetes Have regular diabetes screenings. This checks your fasting blood sugar level. Have the screening done: Once every three years after age 90 if you are at a normal weight and have a low risk for diabetes. More often and at a younger age if you are overweight or have a high risk for diabetes. What should I know about preventing infection? Hepatitis B If you have a higher risk for hepatitis B, you should be screened for this virus. Talk with your health care provider to find out if you are at risk for hepatitis B infection. Hepatitis C Blood testing is recommended for: Everyone born from [birth year] through  [birth year]. Anyone with known risk factors for hepatitis C. Sexually transmitted infections (STIs) You should be screened each year for STIs, including gonorrhea and chlamydia, if: You are sexually active and are younger than 82 years of age. You are older than 82 years of age and your health care provider tells you that you are at risk for this type of infection. Your sexual activity has changed since you were last  screened, and you are at increased risk for chlamydia or gonorrhea. Ask your health care provider if you are at risk. Ask your health care provider about whether you are at high risk for HIV. Your health care provider may recommend a prescription medicine to help prevent HIV infection. If you choose to take medicine to prevent HIV, you should first get tested for HIV. You should then be tested every 3 months for as long as you are taking the medicine. Follow these instructions at home: Alcohol use Do not drink alcohol if your health care provider tells you not to drink. If you drink alcohol: Limit how much you have to 0-2 drinks a day. Know how much alcohol is in your drink. In the [place] one drink equals one 12 oz bottle of beer (355 mL), one 5 oz glass of wine (148 mL), or one 1 oz glass of hard liquor (44 mL). Lifestyle Do not use any products that contain nicotine or tobacco. These products include cigarettes, chewing tobacco, and vaping devices, such as e-cigarettes. If you need help quitting, ask your health care provider. Do not use street drugs. Do not share needles. Ask your health care provider for help if you need support or information about quitting drugs. General instructions Schedule regular health, dental, and eye exams. Stay current with your vaccines. Tell your health care provider if: You often feel depressed. You have ever been abused or do not feel safe at home. Summary Adopting a healthy lifestyle and getting preventive care are important in promoting health and wellness. Follow your health care provider's instructions about healthy diet, exercising, and getting tested or screened for diseases. Follow your health care provider's instructions on monitoring your cholesterol and blood pressure. This information is not intended to replace advice given to you by your health care provider. Make sure you discuss any questions you have with your health care provider. Document  Revised: 09/11/2020 Document Reviewed: 09/11/2020 Elsevier Patient Education  2024 [employer].

## 2023-02-12 NOTE — Progress Notes (Unsigned)
Subjective:    Patient ID: Ronald Ross, male    DOB: [birth year], 83 y.o.   MRN: [id number]     HPI Ronald Ross is here for a physical exam and his chronic medical problems.   Overall doing well.  No concerns.   Medications and allergies reviewed with patient and updated if appropriate.  Current Outpatient Medications on File Prior to Visit  Medication Sig Dispense Refill   amLODipine (NORVASC) 5 MG tablet Take 1 tablet (5 mg total) by mouth daily. 7 tablet 0   aspirin 81 MG tablet Take 81 mg by mouth daily.       atorvastatin (LIPITOR) 20 MG tablet Take 1 tablet (20 mg total) by mouth daily. 7 tablet 0   Ferrous Sulfate (IRON) 325 (65 FE) MG TABS Take by mouth daily.     losartan (COZAAR) 25 MG tablet Take 1 tablet (25 mg total) by mouth daily. 7 tablet 0   Magnesium 250 MG TABS      Multiple Vitamin (MULTIVITAMIN) tablet Take 1 tablet by mouth daily.       Omega-3 Fatty Acids (FISH OIL) 1000 MG CAPS Take by mouth 2 (two) times daily.       No current facility-administered medications on file prior to visit.    Review of Systems  Constitutional:  Negative for fever.  Eyes:  Negative for visual disturbance.  Respiratory:  Negative for cough, shortness of breath and wheezing.   Cardiovascular:  Negative for chest pain, palpitations and leg swelling.  Gastrointestinal:  Negative for abdominal pain, blood in stool, constipation and diarrhea.       No gerd  Genitourinary:  Negative for dysuria and hematuria.  Musculoskeletal:  Positive for back pain (lower back). Negative for arthralgias.  Skin:  Negative for rash.  Neurological:  Negative for light-headedness and headaches.  Hematological:  Bruises/bleeds easily (bruises easily).  Psychiatric/Behavioral:  Negative for dysphoric mood and sleep disturbance. The patient is not nervous/anxious.        Objective:   Vitals:   02/13/23 0816  BP: 122/72  Pulse: 62  Temp: 98 F (36.7 C)  SpO2: 97%   Filed Weights    02/13/23 0816  Weight: 159 lb (72.1 kg)   Body mass index is 22.81 kg/m.  BP Readings from Last 3 Encounters:  02/13/23 122/72  01/28/22 120/74  01/26/21 118/70    Wt Readings from Last 3 Encounters:  02/13/23 159 lb (72.1 kg)  01/07/23 157 lb (71.2 kg)  01/28/22 156 lb (70.8 kg)      Physical Exam Constitutional: He appears well-developed and well-nourished. No distress.  HENT:  Head: Normocephalic and atraumatic.  Right Ear: External ear normal.  Left Ear: External ear normal.  Normal ear canals and TM b/l  Mouth/Throat: Oropharynx is clear and moist. Eyes: Conjunctivae and EOM are normal.  Neck: Neck supple. No tracheal deviation present. No thyromegaly present.  No carotid bruit  Cardiovascular: Normal rate, regular rhythm, normal heart sounds and intact distal pulses.   No murmur heard.  No lower extremity edema. Pulmonary/Chest: Effort normal and breath sounds normal. No respiratory distress. He has no wheezes. He has no rales.  Abdominal: Soft. He exhibits no distension. There is no tenderness.  Genitourinary: deferred  Lymphadenopathy:   He has no cervical adenopathy.  Skin: Skin is warm and dry. He is not diaphoretic.  Psychiatric: He has a normal mood and affect. His behavior is normal.  Assessment & Plan:   Physical exam: Screening blood work  ordered Exercise   walking Weight  normal Substance abuse   none   Reviewed recommended immunizations.   Flu immunization administered today.     Health Maintenance  Topic Date Due   INFLUENZA VACCINE  12/05/2022   COVID-19 Vaccine (6 - 2023-24 season) 05/14/2023   Medicare Annual Wellness (AWV)  01/07/2024   DTaP/Tdap/Td (4 - Td or Tdap) 03/01/2030   Pneumonia Vaccine 38+ Years old  Completed   Zoster Vaccines- Shingrix  Completed   HPV VACCINES  Aged Out   Hepatitis C Screening  Discontinued     See Problem List for Assessment and Plan of chronic medical problems.

## 2023-02-13 ENCOUNTER — Ambulatory Visit (INDEPENDENT_AMBULATORY_CARE_PROVIDER_SITE_OTHER): Payer: Medicare HMO | Admitting: Internal Medicine

## 2023-02-13 VITALS — BP 122/72 | HR 62 | Temp 98.0°F | Ht 70.0 in | Wt 159.0 lb

## 2023-02-13 DIAGNOSIS — R7303 Prediabetes: Secondary | ICD-10-CM

## 2023-02-13 DIAGNOSIS — I10 Essential (primary) hypertension: Secondary | ICD-10-CM | POA: Diagnosis not present

## 2023-02-13 DIAGNOSIS — Z23 Encounter for immunization: Secondary | ICD-10-CM

## 2023-02-13 DIAGNOSIS — K439 Ventral hernia without obstruction or gangrene: Secondary | ICD-10-CM | POA: Diagnosis not present

## 2023-02-13 DIAGNOSIS — Z8546 Personal history of malignant neoplasm of prostate: Secondary | ICD-10-CM | POA: Diagnosis not present

## 2023-02-13 DIAGNOSIS — Z0000 Encounter for general adult medical examination without abnormal findings: Secondary | ICD-10-CM

## 2023-02-13 DIAGNOSIS — E7849 Other hyperlipidemia: Secondary | ICD-10-CM | POA: Diagnosis not present

## 2023-02-13 DIAGNOSIS — Z125 Encounter for screening for malignant neoplasm of prostate: Secondary | ICD-10-CM

## 2023-02-13 LAB — CBC WITH DIFFERENTIAL/PLATELET
Basophils Absolute: 0 10*3/uL (ref 0.0–0.1)
Basophils Relative: 0.6 % (ref 0.0–3.0)
Eosinophils Absolute: 0.2 10*3/uL (ref 0.0–0.7)
Eosinophils Relative: 3 % (ref 0.0–5.0)
HCT: 43.1 % (ref 39.0–52.0)
Hemoglobin: 14.6 g/dL (ref 13.0–17.0)
Lymphocytes Relative: 33.1 % (ref 12.0–46.0)
Lymphs Abs: 2.1 10*3/uL (ref 0.7–4.0)
MCHC: 33.9 g/dL (ref 30.0–36.0)
MCV: 94.6 fL (ref 78.0–100.0)
Monocytes Absolute: 0.5 10*3/uL (ref 0.1–1.0)
Monocytes Relative: 7.5 % (ref 3.0–12.0)
Neutro Abs: 3.6 10*3/uL (ref 1.4–7.7)
Neutrophils Relative %: 55.8 % (ref 43.0–77.0)
Platelets: 203 10*3/uL (ref 150.0–400.0)
RBC: 4.55 Mil/uL (ref 4.22–5.81)
RDW: 13.2 % (ref 11.5–15.5)
WBC: 6.5 10*3/uL (ref 4.0–10.5)

## 2023-02-13 LAB — COMPREHENSIVE METABOLIC PANEL
ALT: 17 U/L (ref 0–53)
AST: 21 U/L (ref 0–37)
Albumin: 3.9 g/dL (ref 3.5–5.2)
Alkaline Phosphatase: 89 U/L (ref 39–117)
BUN: 26 mg/dL — ABNORMAL HIGH (ref 6–23)
CO2: 29 meq/L (ref 19–32)
Calcium: 9.5 mg/dL (ref 8.4–10.5)
Chloride: 106 meq/L (ref 96–112)
Creatinine, Ser: 1.2 mg/dL (ref 0.40–1.50)
GFR: 56.33 mL/min — ABNORMAL LOW (ref >60.00)
Glucose, Bld: 106 mg/dL — ABNORMAL HIGH (ref 70–99)
Potassium: 4.6 meq/L (ref 3.5–5.1)
Sodium: 142 meq/L (ref 135–145)
Total Bilirubin: 0.4 mg/dL (ref 0.2–1.2)
Total Protein: 6.1 g/dL (ref 6.0–8.3)

## 2023-02-13 LAB — LIPID PANEL
Cholesterol: 132 mg/dL (ref 0–200)
HDL: 45 mg/dL (ref >39.00)
LDL Cholesterol: 76 mg/dL (ref 0–99)
NonHDL: 87.1
Total CHOL/HDL Ratio: 3
Triglycerides: 58 mg/dL (ref 0.0–149.0)
VLDL: 11.6 mg/dL (ref 0.0–40.0)

## 2023-02-13 LAB — HEMOGLOBIN A1C: Hgb A1c MFr Bld: 5.9 % (ref 4.6–6.5)

## 2023-02-13 MED ORDER — ATORVASTATIN CALCIUM 20 MG PO TABS
20.0000 mg | ORAL_TABLET | Freq: Every day | ORAL | 3 refills | Status: DC
Start: 2023-02-13 — End: 2024-02-13

## 2023-02-13 MED ORDER — LOSARTAN POTASSIUM 25 MG PO TABS
25.0000 mg | ORAL_TABLET | Freq: Every day | ORAL | 3 refills | Status: DC
Start: 2023-02-13 — End: 2024-02-13

## 2023-02-13 MED ORDER — AMLODIPINE BESYLATE 5 MG PO TABS
5.0000 mg | ORAL_TABLET | Freq: Every day | ORAL | 3 refills | Status: DC
Start: 2023-02-13 — End: 2024-02-13

## 2023-02-13 NOTE — Assessment & Plan Note (Signed)
Chronic Lab Results  Component Value Date   HGBA1C 5.8 01/28/2022   Check a1c Low sugar / carb diet Stressed regular exercise

## 2023-02-13 NOTE — Assessment & Plan Note (Signed)
Chronic BP well controlled CBC, CMP Continue amlodipine 5 mg daily, losartan 25 mg daily cmp

## 2023-02-13 NOTE — Assessment & Plan Note (Signed)
Chronic Check lipid panel  Continue atorvastatin 20 mg daily Regular exercise and healthy diet encouraged  

## 2023-02-13 NOTE — Assessment & Plan Note (Addendum)
S/P radical prostatectomy 1998 No longer following with urology deferred PSA

## 2023-02-13 NOTE — Assessment & Plan Note (Signed)
Chronic Nontender, reducible monitor

## 2023-04-21 DIAGNOSIS — H0288A Meibomian gland dysfunction right eye, upper and lower eyelids: Secondary | ICD-10-CM | POA: Diagnosis not present

## 2023-04-21 DIAGNOSIS — H02831 Dermatochalasis of right upper eyelid: Secondary | ICD-10-CM | POA: Diagnosis not present

## 2023-04-21 DIAGNOSIS — H0288B Meibomian gland dysfunction left eye, upper and lower eyelids: Secondary | ICD-10-CM | POA: Diagnosis not present

## 2023-04-21 DIAGNOSIS — H47323 Drusen of optic disc, bilateral: Secondary | ICD-10-CM | POA: Diagnosis not present

## 2023-04-21 DIAGNOSIS — H18513 Endothelial corneal dystrophy, bilateral: Secondary | ICD-10-CM | POA: Diagnosis not present

## 2023-04-21 DIAGNOSIS — H02834 Dermatochalasis of left upper eyelid: Secondary | ICD-10-CM | POA: Diagnosis not present

## 2023-04-21 DIAGNOSIS — H43813 Vitreous degeneration, bilateral: Secondary | ICD-10-CM | POA: Diagnosis not present

## 2023-04-21 DIAGNOSIS — H353131 Nonexudative age-related macular degeneration, bilateral, early dry stage: Secondary | ICD-10-CM | POA: Diagnosis not present

## 2023-04-21 DIAGNOSIS — H35373 Puckering of macula, bilateral: Secondary | ICD-10-CM | POA: Diagnosis not present

## 2023-04-21 DIAGNOSIS — H04123 Dry eye syndrome of bilateral lacrimal glands: Secondary | ICD-10-CM | POA: Diagnosis not present

## 2023-05-13 ENCOUNTER — Other Ambulatory Visit (HOSPITAL_COMMUNITY)
Admission: RE | Admit: 2023-05-13 | Discharge: 2023-05-13 | Disposition: A | Discharge status: Home / Self Care | Payer: Self-pay | Source: Ambulatory Visit | Attending: Oncology | Admitting: Oncology

## 2023-05-13 DIAGNOSIS — Z006 Encounter for examination for normal comparison and control in clinical research program: Secondary | ICD-10-CM | POA: Insufficient documentation

## 2023-05-26 LAB — GENECONNECT MOLECULAR SCREEN: Genetic Analysis Overall Interpretation: NEGATIVE

## 2024-01-08 ENCOUNTER — Ambulatory Visit: Payer: Medicare HMO

## 2024-01-08 VITALS — BP 110/70 | HR 67 | Ht 69.5 in | Wt 159.0 lb

## 2024-01-08 DIAGNOSIS — Z23 Encounter for immunization: Secondary | ICD-10-CM

## 2024-01-08 DIAGNOSIS — Z0000 Encounter for general adult medical examination without abnormal findings: Secondary | ICD-10-CM | POA: Diagnosis not present

## 2024-01-08 NOTE — Progress Notes (Signed)
 Subjective:   Ronald Ross is a 83 y.o. who presents for a Medicare Wellness preventive visit.  As a reminder, Annual Wellness Visits don't include a physical exam, and some assessments may be limited, especially if this visit is performed virtually. We may recommend an in-person follow-up visit with your provider if needed.  Visit Complete: In person  Persons Participating in Visit: Patient.  AWV Questionnaire: Yes: Patient Medicare AWV questionnaire was completed by the patient on 01/04/2024; I have confirmed that all information answered by patient is correct and no changes since this date.  Cardiac Risk Factors include: advanced age (>47men, >62 women);hypertension;male gender;dyslipidemia     Objective:    Today's Vitals   01/08/24 0950  Weight: 159 lb (72.1 kg)  Height: 5' 9.5 (1.765 m)   Body mass index is 23.14 kg/m.     01/08/2024   10:09 AM 01/07/2023   10:00 AM 01/16/2022   10:23 AM 01/18/2015    4:36 PM  Advanced Directives  Does Patient Have a Medical Advance Directive? Yes Yes Yes Yes   Type of Estate agent of [place];Living will Healthcare Power of [place];Living will Healthcare Power of [place];Living will   Copy of Healthcare Power of Attorney in Chart? No - copy requested No - copy requested No - copy requested Yes      Data saved with a previous flowsheet row definition    Current Medications (verified) Outpatient Encounter Medications as of 01/08/2024  Medication Sig   amLODipine  (NORVASC ) 5 MG tablet Take 1 tablet (5 mg total) by mouth daily.   aspirin 81 MG tablet Take 81 mg by mouth daily.     atorvastatin  (LIPITOR) 20 MG tablet Take 1 tablet (20 mg total) by mouth daily.   Ferrous Sulfate (IRON) 325 (65 FE) MG TABS Take by mouth daily.   losartan  (COZAAR ) 25 MG tablet Take 1 tablet (25 mg total) by mouth daily.   Multiple Vitamin (MULTIVITAMIN) tablet Take 1 tablet by mouth daily.     Magnesium 250 MG TABS    Omega-3  Fatty Acids (FISH OIL) 1000 MG CAPS Take by mouth 2 (two) times daily.     No facility-administered encounter medications on file as of 01/08/2024.    Allergies (verified) Patient has no known allergies.   History: Past Medical History:  Diagnosis Date   Anemia    blood donor   Fuchs' corneal dystrophy    Dr. Gust; [facility] , [place]   Hyperlipidemia    Hypertension    Personal history of prostate cancer    Dr. Mardy   Past Surgical History:  Procedure Laterality Date   COLONOSCOPY  2009   Dr Aneita, virtual colonoscopy recommended  in 2013 because of tortuous colon   colonoscopy with polypectomy  2003   EYE SURGERY  1999 & 2010   cataract & implant bilaterally   PROSTATE SURGERY  1998   prostatectomy   Family History  Problem Relation Age of Onset   Hypertension Mother    Heart attack Mother        in 59s   Atrial fibrillation Mother        Aortic Stenosis   Anemia Mother        ? slow GI bleed   Lung cancer Father        smoker   Breast cancer Sister    Dementia Paternal Grandmother    Stroke Neg Hx    Diabetes Neg Hx  Social History   Socioeconomic History   Marital status: Widowed    Spouse name: Not on file   Number of children: 1   Years of education: Not on file   Highest education level: Bachelor's degree (e.g., BA, AB, BS)  Occupational History   Occupation: Retired  Tobacco Use   Smoking status: Never   Smokeless tobacco: Never  Vaping Use   Vaping status: Not on file  Substance and Sexual Activity   Alcohol use: Yes    Alcohol/week: 0.0 standard drinks of alcohol    Comment:  seldom   Drug use: No   Sexual activity: Not on file  Other Topics Concern   Not on file  Social History Narrative   Lives alone/2025   Social Drivers of Health   Financial Resource Strain: Low Risk  (01/04/2024)   Overall Financial Resource Strain (CARDIA)    Difficulty of Paying Living Expenses: Not hard at all  Food Insecurity: No Food Insecurity  (01/04/2024)   Hunger Vital Sign    Worried About Running Out of Food in the Last Year: Never true    Ran Out of Food in the Last Year: Never true  Transportation Needs: No Transportation Needs (01/04/2024)   PRAPARE - Administrator, Civil Service (Medical): No    Lack of Transportation (Non-Medical): No  Physical Activity: Sufficiently Active (01/08/2024)   Exercise Vital Sign    Days of Exercise per Week: 7 days    Minutes of Exercise per Session: 30 min  Stress: No Stress Concern Present (01/04/2024)   [employer] of Occupational Health - Occupational Stress Questionnaire    Feeling of Stress: Not at all  Social Connections: Moderately Integrated (01/04/2024)   Social Connection and Isolation Panel    Frequency of Communication with Friends and Family: Three times a week    Frequency of Social Gatherings with Friends and Family: Twice a week    Attends Religious Services: More than 4 times per year    Active Member of [employer] or Organizations: Yes    Attends Banker Meetings: More than 4 times per year    Marital Status: Widowed    Tobacco Counseling Counseling given: Not Answered    Clinical Intake:  Pre-visit preparation completed: Yes  Pain : No/denies pain     BMI - recorded: 22.81 Nutritional Status: BMI of 19-24  Normal Nutritional Risks: None Diabetes: No  Lab Results  Component Value Date   HGBA1C 5.9 02/13/2023   HGBA1C 5.8 01/28/2022   HGBA1C 5.8 01/26/2021     How often do you need to have someone help you when you read instructions, pamphlets, or other written materials from your doctor or pharmacy?: 1 - Never  Interpreter Needed?: No  Information entered by :: Makena Mcgrady, RMA   Activities of Daily Living     01/08/2024    9:51 AM  In your present state of health, do you have any difficulty performing the following activities:  Hearing? 0  Vision? 0  Difficulty concentrating or making decisions? 0  Walking or  climbing stairs? 0  Dressing or bathing? 0  Doing errands, shopping? 0  Preparing Food and eating ? N  Using the Toilet? N  In the past six months, have you accidently leaked urine? Y  Comment wears depends  Do you have problems with loss of bowel control? N  Managing your Medications? N  Managing your Finances? N  Housekeeping or managing your Housekeeping? N  Patient Care Team: Geofm Glade PARAS, MD as PCP - General (Internal Medicine) [facility] as Consulting Physician (Ophthalmology) Gust Penna, MD as Consulting Physician (Ophthalmology)  I have updated your Care Teams any recent Medical Services you may have received from other providers in the past year.     Assessment:   This is a routine wellness examination for Ronald Ross.  Hearing/Vision screen Hearing Screening - Comments:: Denies hearing difficulties   Vision Screening - Comments:: Wears eyeglasses/ Dr. Sandya Thimmapa/[place]    Goals Addressed             This Visit's Progress    My goal is to stay physically active.   On track      Depression Screen     01/08/2024   10:11 AM 01/07/2023    9:57 AM 01/16/2022   10:33 AM 01/16/2022   10:28 AM 01/26/2021    8:15 AM 01/26/2020    8:23 AM 01/25/2019    8:30 AM  PHQ 2/9 Scores  PHQ - 2 Score 0 0 0 0 0 0 0  PHQ- 9 Score 0 0   0      Fall Risk     01/08/2024   10:09 AM 01/07/2023   10:01 AM 01/03/2023   11:19 AM 01/16/2022   10:28 AM 01/26/2021    8:13 AM  Fall Risk   Falls in the past year? 0 0 0 0 0  Number falls in past yr: 0 0 0 0 0  Injury with Fall? 0 0 0 0 0  Risk for fall due to : Impaired balance/gait No Fall Risks  No Fall Risks No Fall Risks  Follow up Falls evaluation completed;Falls prevention discussed Falls prevention discussed  Falls prevention discussed  Falls evaluation completed      Data saved with a previous flowsheet row definition    MEDICARE RISK AT HOME:  Medicare Risk at Home Any stairs in or around the home?:  Yes (has a summer home that has outdoor and indoor stairs) If so, are there any without handrails?: No Home free of loose throw rugs in walkways, pet beds, electrical cords, etc?: Yes Adequate lighting in your home to reduce risk of falls?: Yes Life alert?: No Use of a cane, walker or w/c?: No Grab bars in the bathroom?: No Shower chair or bench in shower?: No Elevated toilet seat or a handicapped toilet?: No  TIMED UP AND GO:  Was the test performed?  Yes  Length of time to ambulate 10 feet: 15 sec Gait slow and steady without use of assistive device  Cognitive Function: Declined/Normal: No cognitive concerns noted by patient or family. Patient alert, oriented, able to answer questions appropriately and recall recent events. No signs of memory loss or confusion.        01/07/2023   10:02 AM 01/16/2022   10:35 AM  6CIT Screen  What Year? 0 points 0 points  What month? 0 points 0 points  What time? 0 points 0 points  Count back from 20 0 points 0 points  Months in reverse 0 points 0 points  Repeat phrase 0 points 0 points  Total Score 0 points 0 points    Immunizations Immunization History  Administered Date(s) Administered   DTaP 03/01/2020   Fluad Quad(high Dose 65+) 01/25/2019, 01/26/2020, 01/26/2021, 01/28/2022   Fluad Trivalent(High Dose 65+) 02/13/2023   INFLUENZA, HIGH DOSE SEASONAL PF 02/09/2013, 01/22/2016, 02/17/2017, 01/23/2018   Influenza Split 02/26/2011   Influenza Whole 03/15/2002,  02/07/2007, 02/10/2008, 02/07/2009, 02/22/2010   Influenza-Unspecified 02/08/2015   Moderna Sars-Covid-2 Vaccination 05/20/2019, 06/17/2019, 03/04/2020   Pfizer Covid-19 Vaccine Bivalent Booster 38yrs & up 02/14/2022   Pfizer(Comirnaty)Fall Seasonal Vaccine 12 years and older 01/12/2023   Pneumococcal Conjugate-13 01/18/2015   Pneumococcal Polysaccharide-23 10/26/2009   Rsv, Mab, Nirsevimab-alip, 0.5 Ml, Neonate To 24 Mos(Beyfortus) 03/07/2022   Td 10/19/2009   Tdap 03/01/2020    Zoster Recombinant(Shingrix) 03/10/2020, 07/27/2020   Zoster, Live 05/06/2005    Screening Tests Health Maintenance  Topic Date Due   INFLUENZA VACCINE  12/05/2023   COVID-19 Vaccine (6 - 2024-25 season) 01/05/2024   Medicare Annual Wellness (AWV)  01/07/2024   DTaP/Tdap/Td (4 - Td or Tdap) 03/01/2030   Pneumococcal Vaccine: 50+ Years  Completed   Zoster Vaccines- Shingrix  Completed   HPV VACCINES  Aged Out   Meningococcal B Vaccine  Aged Out   Hepatitis C Screening  Discontinued    Health Maintenance  Health Maintenance Due  Topic Date Due   INFLUENZA VACCINE  12/05/2023   COVID-19 Vaccine (6 - 2024-25 season) 01/05/2024   Medicare Annual Wellness (AWV)  01/07/2024   Health Maintenance Items Addressed: See Nurse Notes at the end of this note  Additional Screening:  Vision Screening: Recommended annual ophthalmology exams for early detection of glaucoma and other disorders of the eye. Would you like a referral to an eye doctor? No    Dental Screening: Recommended annual dental exams for proper oral hygiene  Community Resource Referral / Chronic Care Management: CRR required this visit?  No   CCM required this visit?  No   Plan:    I have personally reviewed and noted the following in the patient's chart:   Medical and social history Use of alcohol, tobacco or illicit drugs  Current medications and supplements including opioid prescriptions. Patient is not currently taking opioid prescriptions. Functional ability and status Nutritional status Physical activity Advanced directives List of other physicians Hospitalizations, surgeries, and ER visits in previous 12 months Vitals Screenings to include cognitive, depression, and falls Referrals and appointments  In addition, I have reviewed and discussed with patient certain preventive protocols, quality metrics, and best practice recommendations. A written personalized care plan for preventive services as  well as general preventive health recommendations were provided to patient.   Kalyssa Anker L Shayra Anton, CMA   01/08/2024   After Visit Summary: (In Person-Printed) AVS printed and given to the patient  Notes: Nothing significant to report at this time.

## 2024-01-08 NOTE — Patient Instructions (Signed)
 [name] , Thank you for taking time out of your busy schedule to complete your Annual Wellness Visit with me. I enjoyed our conversation and look forward to speaking with you again next year. I, as well as your care team,  appreciate your ongoing commitment to your health goals. Please review the following plan we discussed and let me know if I can assist you in the future. Your Game plan/ To Do List    Follow up Visits: We will see or speak with you next year for your Next Medicare AWV with our clinical staff Have you seen your provider in the last 6 months (3 months if uncontrolled diabetes)? No.  Next office visit on 02/13/2024.  Clinician Recommendations:  Aim for 30 minutes of exercise or brisk walking, 6-8 glasses of water, and 5 servings of fruits and vegetables each day. Keep up the good work.      This is a list of the screenings recommended for you:  Health Maintenance  Topic Date Due   Flu Shot  12/05/2023   COVID-19 Vaccine (6 - 2024-25 season) 01/05/2024   Medicare Annual Wellness Visit  01/07/2024   DTaP/Tdap/Td vaccine (4 - Td or Tdap) 03/01/2030   Pneumococcal Vaccine for age over 42  Completed   Zoster (Shingles) Vaccine  Completed   HPV Vaccine  Aged Out   Meningitis B Vaccine  Aged Out   Hepatitis C Screening  Discontinued    Advanced directives: (Copy Requested) Please bring a copy of your health care power of attorney and living will to the office to be added to your chart at your convenience. You can mail to [facility] [address] [place], [place] [address] or email to [email or web address] Advance Care Planning is important because it:  [x]  Makes sure you receive the medical care that is consistent with your values, goals, and preferences  [x]  It provides guidance to your family and loved ones and reduces their decisional burden about whether or not they are making the right decisions based on your wishes.  Follow the link provided  in your after visit summary or read over the paperwork we have mailed to you to help you started getting your Advance Directives in place. If you need assistance in completing these, please reach out to us  so that we can help you!  See attachments for Preventive Care and Fall Prevention Tips.

## 2024-02-12 ENCOUNTER — Encounter: Payer: Self-pay | Admitting: Internal Medicine

## 2024-02-12 DIAGNOSIS — N1831 Chronic kidney disease, stage 3a: Secondary | ICD-10-CM | POA: Insufficient documentation

## 2024-02-12 NOTE — Progress Notes (Unsigned)
 Subjective:    Patient ID: Ronald Ross, male    DOB: 1940/10/10, 83 y.o.   MRN: [id number]     HPI Ronald Ross is here for a physical exam and his chronic medical problems.  Doing well. No concerns.      Medications and allergies reviewed with patient and updated if appropriate.  Current Outpatient Medications on File Prior to Visit  Medication Sig Dispense Refill   amLODipine  (NORVASC ) 5 MG tablet Take 1 tablet (5 mg total) by mouth daily. 90 tablet 3   aspirin 81 MG tablet Take 81 mg by mouth daily.       atorvastatin  (LIPITOR) 20 MG tablet Take 1 tablet (20 mg total) by mouth daily. 90 tablet 3   Ferrous Sulfate (IRON) 325 (65 FE) MG TABS Take by mouth daily.     losartan  (COZAAR ) 25 MG tablet Take 1 tablet (25 mg total) by mouth daily. 90 tablet 3   Multiple Vitamin (MULTIVITAMIN) tablet Take 1 tablet by mouth daily.       No current facility-administered medications on file prior to visit.    Review of Systems  Constitutional:  Negative for appetite change and fever.  Eyes:  Negative for visual disturbance.  Respiratory:  Negative for cough, shortness of breath and wheezing.   Cardiovascular:  Positive for leg swelling (mild after sitting too long). Negative for chest pain and palpitations.  Gastrointestinal:  Negative for abdominal pain, blood in stool, constipation and diarrhea.       No gerd  Genitourinary:  Negative for difficulty urinating, dysuria and hematuria.  Musculoskeletal:  Positive for arthralgias (mild) and back pain (lower back).  Skin:  Negative for rash.  Neurological:  Negative for dizziness, light-headedness, numbness and headaches.  Hematological:  Bruises/bleeds easily (bruises easily).  Psychiatric/Behavioral:  Negative for dysphoric mood and sleep disturbance. The patient is not nervous/anxious.        Objective:   Vitals:   02/13/24 0803  BP: 118/74  Pulse: (!) 59  Temp: 98.3 F (36.8 C)  SpO2: 95%   Filed Weights   02/13/24  0803  Weight: 155 lb (70.3 kg)   Body mass index is 22.56 kg/m.  BP Readings from Last 3 Encounters:  02/13/24 118/74  01/08/24 110/70  02/13/23 122/72    Wt Readings from Last 3 Encounters:  02/13/24 155 lb (70.3 kg)  01/08/24 159 lb (72.1 kg)  02/13/23 159 lb (72.1 kg)      Physical Exam Constitutional: He appears well-developed and well-nourished. No distress.  HENT:  Head: Normocephalic and atraumatic.  Right Ear: External ear normal.  Left Ear: External ear normal.  Normal ear canals and TM b/l  Mouth/Throat: Oropharynx is clear and moist. Eyes: Conjunctivae and EOM are normal.  Neck: Neck supple. No tracheal deviation present. No thyromegaly present.  No carotid bruit  Cardiovascular: Normal rate, regular rhythm, normal heart sounds and intact distal pulses.   No murmur heard.  No lower extremity edema. Pulmonary/Chest: Effort normal and breath sounds normal. No respiratory distress. He has no wheezes. He has no rales.  Abdominal: Soft. He exhibits no distension. There is no tenderness.  Genitourinary: deferred  Lymphadenopathy:   He has no cervical adenopathy.  Skin: Skin is warm and dry. He is not diaphoretic.  Psychiatric: He has a normal mood and affect. His behavior is normal.         Assessment & Plan:   Physical exam: Screening blood work  ordered Exercise  walks almost daily - about 2 miles Weight  normal Substance abuse   none   Reviewed recommended immunizations.   Health Maintenance  Topic Date Due   COVID-19 Vaccine (6 - 2025-26 season) 01/05/2024   Medicare Annual Wellness (AWV)  01/07/2025   DTaP/Tdap/Td (4 - Td or Tdap) 03/01/2030   Pneumococcal Vaccine: 50+ Years  Completed   Influenza Vaccine  Completed   Zoster Vaccines- Shingrix  Completed   Meningococcal B Vaccine  Aged Out   Hepatitis C Screening  Discontinued     See Problem List for Assessment and Plan of chronic medical problems.

## 2024-02-12 NOTE — Patient Instructions (Addendum)
 Blood work was ordered.       Medications changes include :   None    A referral was ordered and someone will call you to schedule an appointment.     Return in about 1 year (around 02/12/2025) for Physical Exam.   Health Maintenance, Male Adopting a healthy lifestyle and getting preventive care are important in promoting health and wellness. Ask your health care provider about: The right schedule for you to have regular tests and exams. Things you can do on your own to prevent diseases and keep yourself healthy. What should I know about diet, weight, and exercise? Eat a healthy diet  Eat a diet that includes plenty of vegetables, fruits, low-fat dairy products, and lean protein. Do not eat a lot of foods that are high in solid fats, added sugars, or sodium. Maintain a healthy weight Body mass index (BMI) is a measurement that can be used to identify possible weight problems. It estimates body fat based on height and weight. Your health care provider can help determine your BMI and help you achieve or maintain a healthy weight. Get regular exercise Get regular exercise. This is one of the most important things you can do for your health. Most adults should: Exercise for at least 150 minutes each week. The exercise should increase your heart rate and make you sweat (moderate-intensity exercise). Do strengthening exercises at least twice a week. This is in addition to the moderate-intensity exercise. Spend less time sitting. Even light physical activity can be beneficial. Watch cholesterol and blood lipids Have your blood tested for lipids and cholesterol at 83 years of age, then have this test every 5 years. You may need to have your cholesterol levels checked more often if: Your lipid or cholesterol levels are high. You are older than 83 years of age. You are at high risk for heart disease. What should I know about cancer screening? Many types of cancers can be  detected early and may often be prevented. Depending on your health history and family history, you may need to have cancer screening at various ages. This may include screening for: Colorectal cancer. Prostate cancer. Skin cancer. Lung cancer. What should I know about heart disease, diabetes, and high blood pressure? Blood pressure and heart disease High blood pressure causes heart disease and increases the risk of stroke. This is more likely to develop in people who have high blood pressure readings or are overweight. Talk with your health care provider about your target blood pressure readings. Have your blood pressure checked: Every 3-5 years if you are 11-60 years of age. Every year if you are 40 years old or older. If you are between the ages of 29 and 35 and are a current or former smoker, ask your health care provider if you should have a one-time screening for abdominal aortic aneurysm (AAA). Diabetes Have regular diabetes screenings. This checks your fasting blood sugar level. Have the screening done: Once every three years after age 72 if you are at a normal weight and have a low risk for diabetes. More often and at a younger age if you are overweight or have a high risk for diabetes. What should I know about preventing infection? Hepatitis B If you have a higher risk for hepatitis B, you should be screened for this virus. Talk with your health care provider to find out if you are at risk for hepatitis B infection. Hepatitis C Blood testing is recommended for:  Everyone born from [birth year] through 1965. Anyone with known risk factors for hepatitis C. Sexually transmitted infections (STIs) You should be screened each year for STIs, including gonorrhea and chlamydia, if: You are sexually active and are younger than 83 years of age. You are older than 83 years of age and your health care provider tells you that you are at risk for this type of infection. Your sexual activity has changed  since you were last screened, and you are at increased risk for chlamydia or gonorrhea. Ask your health care provider if you are at risk. Ask your health care provider about whether you are at high risk for HIV. Your health care provider may recommend a prescription medicine to help prevent HIV infection. If you choose to take medicine to prevent HIV, you should first get tested for HIV. You should then be tested every 3 months for as long as you are taking the medicine. Follow these instructions at home: Alcohol use Do not drink alcohol if your health care provider tells you not to drink. If you drink alcohol: Limit how much you have to 0-2 drinks a day. Know how much alcohol is in your drink. In the [place] one drink equals one 12 oz bottle of beer (355 mL), one 5 oz glass of wine (148 mL), or one 1 oz glass of hard liquor (44 mL). Lifestyle Do not use any products that contain nicotine or tobacco. These products include cigarettes, chewing tobacco, and vaping devices, such as e-cigarettes. If you need help quitting, ask your health care provider. Do not use street drugs. Do not share needles. Ask your health care provider for help if you need support or information about quitting drugs. General instructions Schedule regular health, dental, and eye exams. Stay current with your vaccines. Tell your health care provider if: You often feel depressed. You have ever been abused or do not feel safe at home. Summary Adopting a healthy lifestyle and getting preventive care are important in promoting health and wellness. Follow your health care provider's instructions about healthy diet, exercising, and getting tested or screened for diseases. Follow your health care provider's instructions on monitoring your cholesterol and blood pressure. This information is not intended to replace advice given to you by your health care provider. Make sure you discuss any questions you have with your health care  provider. Document Revised: 09/11/2020 Document Reviewed: 09/11/2020 Elsevier Patient Education  2024 [employer].

## 2024-02-13 ENCOUNTER — Ambulatory Visit: Payer: Self-pay | Admitting: Internal Medicine

## 2024-02-13 ENCOUNTER — Ambulatory Visit: Payer: Medicare HMO | Admitting: Internal Medicine

## 2024-02-13 VITALS — BP 118/74 | HR 59 | Temp 98.3°F | Ht 69.5 in | Wt 155.0 lb

## 2024-02-13 DIAGNOSIS — Z0000 Encounter for general adult medical examination without abnormal findings: Secondary | ICD-10-CM | POA: Diagnosis not present

## 2024-02-13 DIAGNOSIS — E7849 Other hyperlipidemia: Secondary | ICD-10-CM

## 2024-02-13 DIAGNOSIS — R7303 Prediabetes: Secondary | ICD-10-CM | POA: Diagnosis not present

## 2024-02-13 DIAGNOSIS — I10 Essential (primary) hypertension: Secondary | ICD-10-CM

## 2024-02-13 DIAGNOSIS — N1831 Chronic kidney disease, stage 3a: Secondary | ICD-10-CM | POA: Diagnosis not present

## 2024-02-13 LAB — COMPREHENSIVE METABOLIC PANEL WITH GFR
ALT: 16 U/L (ref 0–53)
AST: 19 U/L (ref 0–37)
Albumin: 4.1 g/dL (ref 3.5–5.2)
Alkaline Phosphatase: 63 U/L (ref 39–117)
BUN: 23 mg/dL (ref 6–23)
CO2: 29 meq/L (ref 19–32)
Calcium: 9.3 mg/dL (ref 8.4–10.5)
Chloride: 106 meq/L (ref 96–112)
Creatinine, Ser: 1.22 mg/dL (ref 0.40–1.50)
GFR: 54.84 mL/min — ABNORMAL LOW (ref >60.00)
Glucose, Bld: 102 mg/dL — ABNORMAL HIGH (ref 70–99)
Potassium: 4.3 meq/L (ref 3.5–5.1)
Sodium: 142 meq/L (ref 135–145)
Total Bilirubin: 0.6 mg/dL (ref 0.2–1.2)
Total Protein: 6.3 g/dL (ref 6.0–8.3)

## 2024-02-13 LAB — CBC
HCT: 40.4 % (ref 39.0–52.0)
Hemoglobin: 13.5 g/dL (ref 13.0–17.0)
MCHC: 33.5 g/dL (ref 30.0–36.0)
MCV: 96.1 fl (ref 78.0–100.0)
Platelets: 182 K/uL (ref 150.0–400.0)
RBC: 4.2 Mil/uL — ABNORMAL LOW (ref 4.22–5.81)
RDW: 13.8 % (ref 11.5–15.5)
WBC: 6 K/uL (ref 4.0–10.5)

## 2024-02-13 LAB — LIPID PANEL
Cholesterol: 140 mg/dL (ref 0–200)
HDL: 48.7 mg/dL (ref >39.00)
LDL Cholesterol: 81 mg/dL (ref 0–99)
NonHDL: 91.23
Total CHOL/HDL Ratio: 3
Triglycerides: 52 mg/dL (ref 0.0–149.0)
VLDL: 10.4 mg/dL (ref 0.0–40.0)

## 2024-02-13 LAB — HEMOGLOBIN A1C: Hgb A1c MFr Bld: 5.6 % (ref 4.6–6.5)

## 2024-02-13 LAB — TSH: TSH: 3.29 u[IU]/mL (ref 0.35–5.50)

## 2024-02-13 MED ORDER — LOSARTAN POTASSIUM 25 MG PO TABS: 25.0000 mg | ORAL_TABLET | Freq: Every day | ORAL | 3 refills | Status: AC

## 2024-02-13 MED ORDER — ATORVASTATIN CALCIUM 20 MG PO TABS: 20.0000 mg | ORAL_TABLET | Freq: Every day | ORAL | 3 refills | Status: AC

## 2024-02-13 MED ORDER — AMLODIPINE BESYLATE 5 MG PO TABS: 5.0000 mg | ORAL_TABLET | Freq: Every day | ORAL | 3 refills | Status: AC

## 2024-02-13 NOTE — Assessment & Plan Note (Signed)
Chronic Check lipid panel, CMP, TSH Continue atorvastatin 20 mg daily Regular exercise and healthy diet encouraged

## 2024-02-13 NOTE — Assessment & Plan Note (Signed)
 Chronic Lab Results  Component Value Date   HGBA1C 5.9 02/13/2023   Check a1c Low sugar / carb diet Stressed regular exercise

## 2024-02-13 NOTE — Assessment & Plan Note (Signed)
Chronic BP well controlled CBC, CMP Continue amlodipine 5 mg daily, losartan 25 mg daily cmp

## 2024-02-13 NOTE — Assessment & Plan Note (Signed)
 Chronic Stage 3a-mild, stable CBC, CMP No NSAIDs, increase fluids Stressed good BP, sugar control

## 2024-10-25 ENCOUNTER — Encounter (INDEPENDENT_AMBULATORY_CARE_PROVIDER_SITE_OTHER): Admitting: Ophthalmology

## 2025-01-12 ENCOUNTER — Ambulatory Visit
